# Patient Record
Sex: Female | Born: 1985 | Race: Black or African American | Hispanic: No | Marital: Single | Smoking: Former smoker
Health system: Southern US, Community
[De-identification: ages and names within clinical notes are randomized; demographics above are authoritative.]

## PROBLEM LIST (undated history)

## (undated) DIAGNOSIS — Z789 Other specified health status: Secondary | ICD-10-CM

## (undated) HISTORY — PX: NO PAST SURGERIES: SHX2092

## (undated) HISTORY — DX: Other specified health status: Z78.9

---
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
## 2012-12-22 ENCOUNTER — Emergency Department (HOSPITAL_COMMUNITY)
Admission: EM | Admit: 2012-12-22 | Discharge: 2012-12-22 | Disposition: A | Discharge status: Home / Self Care | Payer: Self-pay | Attending: Emergency Medicine | Admitting: Emergency Medicine

## 2012-12-22 ENCOUNTER — Encounter (HOSPITAL_COMMUNITY): Payer: Self-pay | Admitting: Emergency Medicine

## 2012-12-22 DIAGNOSIS — R112 Nausea with vomiting, unspecified: Secondary | ICD-10-CM | POA: Insufficient documentation

## 2012-12-22 DIAGNOSIS — R109 Unspecified abdominal pain: Secondary | ICD-10-CM | POA: Insufficient documentation

## 2012-12-22 DIAGNOSIS — R509 Fever, unspecified: Secondary | ICD-10-CM | POA: Insufficient documentation

## 2012-12-22 DIAGNOSIS — R102 Pelvic and perineal pain: Secondary | ICD-10-CM

## 2012-12-22 DIAGNOSIS — N898 Other specified noninflammatory disorders of vagina: Secondary | ICD-10-CM | POA: Insufficient documentation

## 2012-12-22 DIAGNOSIS — Z3202 Encounter for pregnancy test, result negative: Secondary | ICD-10-CM | POA: Insufficient documentation

## 2012-12-22 DIAGNOSIS — R197 Diarrhea, unspecified: Secondary | ICD-10-CM | POA: Insufficient documentation

## 2012-12-22 LAB — CBC WITH DIFFERENTIAL/PLATELET
Basophils Absolute: 0 10*3/uL (ref 0.0–0.1)
Basophils Relative: 1 % (ref 0–1)
HCT: 37.4 % (ref 36.0–46.0)
Lymphocytes Relative: 35 % (ref 12–46)
Lymphs Abs: 2 10*3/uL (ref 0.7–4.0)
MCH: 27.8 pg (ref 26.0–34.0)
Neutro Abs: 3 10*3/uL (ref 1.7–7.7)
Platelets: 292 10*3/uL (ref 150–400)
RBC: 4.5 MIL/uL (ref 3.87–5.11)
RDW: 13.7 % (ref 11.5–15.5)
WBC: 5.9 10*3/uL (ref 4.0–10.5)

## 2012-12-22 LAB — COMPREHENSIVE METABOLIC PANEL
ALT: 6 U/L (ref 0–35)
AST: 16 U/L (ref 0–37)
Albumin: 3.3 g/dL — ABNORMAL LOW (ref 3.5–5.2)
Alkaline Phosphatase: 68 U/L (ref 39–117)
CO2: 25 mEq/L (ref 19–32)
Calcium: 9.3 mg/dL (ref 8.4–10.5)
Chloride: 99 mEq/L (ref 96–112)
GFR calc non Af Amer: 89 mL/min — ABNORMAL LOW (ref >90)
Glucose, Bld: 104 mg/dL — ABNORMAL HIGH (ref 70–99)
Sodium: 133 mEq/L — ABNORMAL LOW (ref 135–145)
Total Bilirubin: 0.3 mg/dL (ref 0.3–1.2)

## 2012-12-22 LAB — WET PREP, GENITAL: Yeast Wet Prep HPF POC: NONE SEEN

## 2012-12-22 LAB — URINALYSIS, ROUTINE W REFLEX MICROSCOPIC
Bilirubin Urine: NEGATIVE
Glucose, UA: NEGATIVE mg/dL
Leukocytes, UA: NEGATIVE
Nitrite: NEGATIVE

## 2012-12-22 LAB — POCT PREGNANCY, URINE: Preg Test, Ur: NEGATIVE

## 2012-12-22 MED ORDER — AZITHROMYCIN 250 MG PO TABS
1000.0000 mg | ORAL_TABLET | Freq: Once | ORAL | Status: AC
  Administered 2012-12-22: 1000 mg via ORAL
  Filled 2012-12-22: qty 4

## 2012-12-22 MED ORDER — PROMETHAZINE HCL 25 MG PO TABS: 25.0000 mg | ORAL_TABLET | Freq: Four times a day (QID) | ORAL | Status: DC | PRN

## 2012-12-22 MED ORDER — CEFTRIAXONE SODIUM 250 MG IJ SOLR
250.0000 mg | Freq: Once | INTRAMUSCULAR | Status: AC
  Administered 2012-12-22: 250 mg via INTRAMUSCULAR
  Filled 2012-12-22: qty 250

## 2012-12-22 MED ORDER — HYDROCODONE-ACETAMINOPHEN 5-325 MG PO TABS: 1.0000 | ORAL_TABLET | ORAL | Status: DC | PRN

## 2012-12-22 MED ORDER — OXYCODONE-ACETAMINOPHEN 5-325 MG PO TABS
2.0000 | ORAL_TABLET | Freq: Once | ORAL | Status: AC
  Administered 2012-12-22: 2 via ORAL
  Filled 2012-12-22: qty 2

## 2012-12-22 NOTE — ED Provider Notes (Signed)
CSN: [id number]     Arrival date & time 12/22/12  1037 History   First MD Initiated Contact with Patient 12/22/12 1121     Chief Complaint  Patient presents with  . Abdominal Pain   (Consider location/radiation/quality/duration/timing/severity/associated sxs/prior Treatment) HPI  27 year old healthy female presents complaining of low abnormal pain. Patient report having suprapubic abdominal pain ongoing for nearly a month. Pain is a dull sensation, light, persistent for the past month however worsening in the past 3 or 4 days. Pain is now sharp, radiates to right lower quadrant worsening with walking and standing straight and improves with lying flat. For the past 2 days she also feels feverish with fever of 101 at home, improved with ibuprofen. Also having nausea, vomiting, and diarrhea. Vomiting is nonbilious non-bloody, diarrhea is nonbloody non-mucousy. Chest decreased appetite. She also noticed yellowish vaginal discharge with mild odor since yesterday. She is sexually active, G0P0, last menstrual period 3 weeks ago. No complaints of headache, chest pain, shortness of breath, back pain, dysuria, hematuria, hematochezia, or melena. No prior history of STD.  History reviewed. No pertinent past medical history. History reviewed. No pertinent past surgical history. History reviewed. No pertinent family history. History  Substance Use Topics  . Smoking status: Not on file  . Smokeless tobacco: Not on file  . Alcohol Use: Not on file   OB History   Grav Para Term Preterm Abortions TAB SAB Ect Mult Living                 Review of Systems  All other systems reviewed and are negative.    Allergies  Review of patient's allergies indicates no known allergies.  Home Medications   Current Outpatient Rx  Name  Route  Sig  Dispense  Refill  . ibuprofen (ADVIL,MOTRIN) 600 MG tablet   Oral   Take 600 mg by mouth every 6 (six) hours as needed for pain.          BP 97/59  Pulse 74   Temp(Src) 97.8 F (36.6 C) (Oral)  Resp 14  SpO2 100%  LMP 12/01/2012 Physical Exam  Nursing note and vitals reviewed. Constitutional: She appears well-developed and well-nourished. No distress.  HENT:  Head: Normocephalic and atraumatic.  Eyes: Conjunctivae are normal.  Neck: Normal range of motion. Neck supple.  Cardiovascular: Normal rate and regular rhythm.   Pulmonary/Chest: Effort normal and breath sounds normal. She exhibits no tenderness.  Abdominal: Soft. There is tenderness (Suprapubic tenderness without guarding or rebound tenderness. No hernia noted.) in the suprapubic area. There is no rigidity, no guarding, no tenderness at McBurney's point and negative Murphy's sign.  Genitourinary: Vagina normal and uterus normal. There is no rash or lesion on the right labia. There is no rash or lesion on the left labia. Cervix exhibits motion tenderness. Cervix exhibits no discharge. Right adnexum displays tenderness. Right adnexum displays no mass. Left adnexum displays tenderness. Left adnexum displays no mass. No erythema, tenderness or bleeding around the vagina. No vaginal discharge found.  Chaperone present:  Lymphadenopathy:       Right: No inguinal adenopathy present.       Left: No inguinal adenopathy present.    ED Course  Procedures (including critical care time)  Suprapubic abd pain.  Moderate tenderness to bilateral adnexa and CMT on pelvic exam.  Wet prep result unremarkable.  Will preemptively treat with rocephin/zithromax for possible infection.    2:52 PM Labs are otherwise reassuring.  Doubt appy.  Doubt TOA or  ovarian torsion.  Pregnancy test negative, doubt ectopic.  Plan to have pt return if worsen.  Pt otherwise stable for discharge.    Labs Review Labs Reviewed  WET PREP, GENITAL - Abnormal; Notable for the following:    Clue Cells Wet Prep HPF POC FEW (*)    WBC, Wet Prep HPF POC FEW (*)    All other components within normal limits  COMPREHENSIVE METABOLIC  PANEL - Abnormal; Notable for the following:    Sodium 133 (*)    Glucose, Bld 104 (*)    BUN 5 (*)    Albumin 3.3 (*)    GFR calc non Af Amer 89 (*)    All other components within normal limits  GC/CHLAMYDIA PROBE AMP  URINALYSIS, ROUTINE W REFLEX MICROSCOPIC  CBC WITH DIFFERENTIAL  LIPASE, BLOOD  POCT PREGNANCY, URINE   Imaging Review No results found.  EKG Interpretation   None       MDM   1. Pelvic pain    BP 97/59  Pulse 74  Temp(Src) 97.8 F (36.6 C) (Oral)  Resp 14  SpO2 100%  LMP 12/01/2012     Fayrene Helper, PA-C 12/22/12 1454

## 2012-12-22 NOTE — ED Notes (Signed)
She c/o lower abd./bladder area pain "for about a month now".  She further c/o "yellowish" vag. D/c.  She is in no distress.

## 2012-12-22 NOTE — ED Provider Notes (Signed)
Medical screening examination/treatment/procedure(s) were performed by non-physician practitioner and as supervising physician I was immediately available for consultation/collaboration.  Dasia Guerrier L Aayan Haskew, MD 12/22/12 1630 

## 2012-12-22 NOTE — Progress Notes (Signed)
[facility] provided patient with a list of primary care resources and a  [facility] card program application.

## 2012-12-23 LAB — GC/CHLAMYDIA PROBE AMP: GC Probe RNA: NEGATIVE

## 2013-07-20 ENCOUNTER — Emergency Department (HOSPITAL_COMMUNITY): Payer: Self-pay

## 2013-07-20 ENCOUNTER — Emergency Department (HOSPITAL_COMMUNITY)
Admission: EM | Admit: 2013-07-20 | Discharge: 2013-07-20 | Disposition: A | Discharge status: Home / Self Care | Payer: Self-pay | Attending: Emergency Medicine | Admitting: Emergency Medicine

## 2013-07-20 ENCOUNTER — Encounter (HOSPITAL_COMMUNITY): Payer: Self-pay | Admitting: Emergency Medicine

## 2013-07-20 DIAGNOSIS — R0602 Shortness of breath: Secondary | ICD-10-CM | POA: Insufficient documentation

## 2013-07-20 DIAGNOSIS — R079 Chest pain, unspecified: Secondary | ICD-10-CM | POA: Insufficient documentation

## 2013-07-20 DIAGNOSIS — R209 Unspecified disturbances of skin sensation: Secondary | ICD-10-CM | POA: Insufficient documentation

## 2013-07-20 DIAGNOSIS — R200 Anesthesia of skin: Secondary | ICD-10-CM

## 2013-07-20 DIAGNOSIS — R202 Paresthesia of skin: Secondary | ICD-10-CM

## 2013-07-20 DIAGNOSIS — F17200 Nicotine dependence, unspecified, uncomplicated: Secondary | ICD-10-CM | POA: Insufficient documentation

## 2013-07-20 DIAGNOSIS — R42 Dizziness and giddiness: Secondary | ICD-10-CM | POA: Insufficient documentation

## 2013-07-20 MED ORDER — LORAZEPAM 1 MG PO TABS
1.0000 mg | ORAL_TABLET | Freq: Once | ORAL | Status: AC
  Administered 2013-07-20: 1 mg via ORAL
  Filled 2013-07-20: qty 1

## 2013-07-20 MED ORDER — OXYCODONE-ACETAMINOPHEN 5-325 MG PO TABS
1.0000 | ORAL_TABLET | Freq: Once | ORAL | Status: AC
  Administered 2013-07-20: 1 via ORAL
  Filled 2013-07-20: qty 1

## 2013-07-20 NOTE — ED Notes (Signed)
Patient transported to X-ray 

## 2013-07-20 NOTE — ED Notes (Signed)
Pt states that this morning she felt dizzy/lightheadedness then after she got done smoking her cigarette she started having central chest tightness and shob.  Pt states that she also has numbness in her left arm and leg. Pt states that last night she had n/v but denies diarrhea.  Pt is currently on her menstrual cycle and taking ibuprofen for the pain.

## 2013-07-20 NOTE — ED Provider Notes (Signed)
CSN: [id number]     Arrival date & time 07/20/13  7517 History   First MD Initiated Contact with Patient 07/20/13 0827     Chief Complaint  Patient presents with  . Dizziness  . Chest Pain  . Shortness of Breath  . Numbness     (Consider location/radiation/quality/duration/timing/severity/associated sxs/prior Treatment) HPI  28 year old female chest pain. Onset approximately one and half hours ago. Describes a tightness in the center chest. This began while she was getting ready for work this morning and worse after smoking a cigarette. Worse when laying on back. Associated with numbness and tingling in her left upper extremity and left lower extremity. Some mild shortness of breath. No cough. No fevers or chills. No unusual leg pain or swelling. No history similar symptoms. Denies drug use.   History reviewed. No pertinent past medical history. History reviewed. No pertinent past surgical history. No family history on file. History  Substance Use Topics  . Smoking status: Current Every Day Smoker  . Smokeless tobacco: Not on file  . Alcohol Use: Yes   OB History   Grav Para Term Preterm Abortions TAB SAB Ect Mult Living                 Review of Systems  All systems reviewed and negative, other than as noted in HPI.   Allergies  Review of patient's allergies indicates no known allergies.  Home Medications   Prior to Admission medications   Medication Sig Start Date End Date Taking? Authorizing Provider  HYDROcodone-acetaminophen (NORCO/VICODIN) 5-325 MG per tablet Take 1 tablet by mouth every 4 (four) hours as needed for pain. 12/22/12   Fayrene Helper, PA-C  ibuprofen (ADVIL,MOTRIN) 600 MG tablet Take 600 mg by mouth every 6 (six) hours as needed for pain.    Historical Provider, MD  promethazine (PHENERGAN) 25 MG tablet Take 1 tablet (25 mg total) by mouth every 6 (six) hours as needed for nausea. 12/22/12   Fayrene Helper, PA-C   BP 114/77  Pulse 63  Temp(Src) 99.2 F  (37.3 C) (Oral)  Resp 18  Ht 5\' 5"  (1.651 m)  Wt 198 lb (89.812 kg)  BMI 32.95 kg/m2  SpO2 100%  LMP 07/19/2013 Physical Exam  Nursing note and vitals reviewed. Constitutional: She appears well-developed and well-nourished. No distress.  Sitting in bed. NAD.   HENT:  Head: Normocephalic and atraumatic.  Eyes: Conjunctivae are normal. Right eye exhibits no discharge. Left eye exhibits no discharge.  Neck: Neck supple.  Cardiovascular: Normal rate, regular rhythm and normal heart sounds.  Exam reveals no gallop and no friction rub.   No murmur heard. Pulmonary/Chest: Effort normal and breath sounds normal. No respiratory distress. She exhibits no tenderness.  Abdominal: Soft. She exhibits no distension. There is no tenderness.  Musculoskeletal: She exhibits no edema and no tenderness.  Lower extremities symmetric as compared to each other. No calf tenderness. Negative Homan's. No palpable cords.   Neurological: She is alert.  Skin: Skin is warm and dry.  Psychiatric: She has a normal mood and affect. Her behavior is normal. Thought content normal.    ED Course  Procedures (including critical care time) Labs Review Labs Reviewed - No data to display  Imaging Review No results found.   EKG Interpretation   Date/Time:  Wednesday Jul 20 2013 08:26:49 EDT Ventricular Rate:  65 PR Interval:  167 QRS Duration: 86 QT Interval:  410 QTC Calculation: 426 R Axis:   65 Text Interpretation:  Sinus  rhythm Baseline wander in lead(s) II III aVR  aVF V1 V4 V6 No old tracing to compare Confirmed by Huston Stonehocker  MD, Carlin Attridge  ([id number]) on 07/20/2013 8:34:42 AM      MDM   Final diagnoses:  Chest pain  Numbness and tingling    27yF with chest pain and numbness in the left side. She is in no distress. Lungs are clear. Nonfocal neurological examination. Doubt ACS. Doubt infectious. Doubt dissection or pulmonary embolism. EKG with no particular concerning findings.    Raeford RazorStephen Tongela Encinas,  MD 07/23/13 [phone]

## 2013-07-20 NOTE — Discharge Instructions (Signed)
Chest Pain (Nonspecific) °It is often hard to give a specific diagnosis for the cause of chest pain. There is always a chance that your pain could be related to something serious, such as a heart attack or a blood clot in the lungs. You need to follow up with your caregiver for further evaluation. °CAUSES  °· Heartburn. °· Pneumonia or bronchitis. °· Anxiety or stress. °· Inflammation around your heart (pericarditis) or lung (pleuritis or pleurisy). °· A blood clot in the lung. °· A collapsed lung (pneumothorax). It can develop suddenly on its own (spontaneous pneumothorax) or from injury (trauma) to the chest. °· Shingles infection (herpes zoster virus). °The chest wall is composed of bones, muscles, and cartilage. Any of these can be the source of the pain. °· The bones can be bruised by injury. °· The muscles or cartilage can be strained by coughing or overwork. °· The cartilage can be affected by inflammation and become sore (costochondritis). °DIAGNOSIS  °Lab tests or other studies, such as X-rays, electrocardiography, stress testing, or cardiac imaging, may be needed to find the cause of your pain.  °TREATMENT  °· Treatment depends on what may be causing your chest pain. Treatment may include: °· Acid blockers for heartburn. °· Anti-inflammatory medicine. °· Pain medicine for inflammatory conditions. °· Antibiotics if an infection is present. °· You may be advised to change lifestyle habits. This includes stopping smoking and avoiding alcohol, caffeine, and chocolate. °· You may be advised to keep your head raised (elevated) when sleeping. This reduces the chance of acid going backward from your stomach into your esophagus. °· Most of the time, nonspecific chest pain will improve within 2 to 3 days with rest and mild pain medicine. °HOME CARE INSTRUCTIONS  °· If antibiotics were prescribed, take your antibiotics as directed. Finish them even if you start to feel better. °· For the next few days, avoid physical  activities that bring on chest pain. Continue physical activities as directed. °· Do not smoke. °· Avoid drinking alcohol. °· Only take over-the-counter or prescription medicine for pain, discomfort, or fever as directed by your caregiver. °· Follow your caregiver's suggestions for further testing if your chest pain does not go away. °· Keep any follow-up appointments you made. If you do not go to an appointment, you could develop lasting (chronic) problems with pain. If there is any problem keeping an appointment, you must call to reschedule. °SEEK MEDICAL CARE IF:  °· You think you are having problems from the medicine you are taking. Read your medicine instructions carefully. °· Your chest pain does not go away, even after treatment. °· You develop a rash with blisters on your chest. °SEEK IMMEDIATE MEDICAL CARE IF:  °· You have increased chest pain or pain that spreads to your arm, neck, jaw, back, or abdomen. °· You develop shortness of breath, an increasing cough, or you are coughing up blood. °· You have severe back or abdominal pain, feel nauseous, or vomit. °· You develop severe weakness, fainting, or chills. °· You have a fever. °THIS IS AN EMERGENCY. Do not wait to see if the pain will go away. Get medical help at once. Call your local emergency services (911 in [place] Do not drive yourself to the hospital. °MAKE SURE YOU:  °· Understand these instructions. °· Will watch your condition. °· Will get help right away if you are not doing well or get worse. °Document Released: 11/20/2004 Document Revised: 05/05/2011 Document Reviewed: 09/16/2007 °ExitCare® Patient Information ©2014 [employer],   LLC. ° ° ° °Emergency Department Resource Guide °1) Find a Doctor and Pay Out of Pocket °Although you won't have to find out who is covered by your insurance plan, it is a good idea to ask around and get recommendations. You will then need to call the office and see if the doctor you have chosen will accept you as a new  patient and what types of options they offer for patients who are self-pay. Some doctors offer discounts or will set up payment plans for their patients who do not have insurance, but you will need to ask so you aren't surprised when you get to your appointment. ° °2) Contact Your Local Health Department °Not all health departments have doctors that can see patients for sick visits, but many do, so it is worth a call to see if yours does. If you don't know where your local health department is, you can check in your phone book. The CDC also has a tool to help you locate your state's health department, and many state websites also have listings of all of their local health departments. ° °3) Find a Walk-in Clinic °If your illness is not likely to be very severe or complicated, you may want to try a walk in clinic. These are popping up all over the country in pharmacies, drugstores, and shopping centers. They're usually staffed by nurse practitioners or physician assistants that have been trained to treat common illnesses and complaints. They're usually fairly quick and inexpensive. However, if you have serious medical issues or chronic medical problems, these are probably not your best option. ° °No Primary Care Doctor: °- Call [facility] at  832-8000 - they can help you locate a primary care doctor that  accepts your insurance, provides certain services, etc. °- Physician Referral Service- 1-800-533-3463 ° °Chronic Pain Problems: °Organization         Address  Phone   Notes  °[facility]  (336) 297-2271 Patients need to be referred by their primary care doctor.  ° °Medication Assistance: °Organization         Address  Phone   Notes  °[place] [facility] [address] °[place], [place] [address] (336) 641-8030 --Must be a resident of [place] °-- Must have NO insurance coverage whatsoever (no Medicaid/ Medicare, etc.) °-- The pt. MUST have a primary  care doctor that directs their care regularly and follows them in the community °  °[facility]  (866) 331-1348   °[employer]  (888) 892-1162   ° °Agencies that provide inexpensive medical care: °Organization         Address  Phone   Notes  °[facility]  (336) 832-8035   ° Internal Medicine    (336) 832-7272   °[facility] [address] °[place], [place] [address] (336) 832-4777   °[facility] [address], °[place] (336) 271-4999   °[facility]    (336) 373-0678   °[facility]    (336) 272-1050   °[facility] ° [address], [place] Phone:  (336) 832-4444, Fax:  (336) 832-4440 Hours of Operation:  9 am - 6 pm, M-F.  Also accepts Medicaid/Medicare and self-pay.  °[facility] ° [address], [place] Phone: (336) 832-3150, Fax: (336) [phone]. Hours of Operation:  8:30 am - 5:30 pm, M-F.  Also accepts Medicaid and self-pay.  °  [facility] [place] [address], [place] Phone: (336) 878-6027   °[facility] [address], [place], [place] (336)723-1848, Ext. 123 Mondays & Thursdays: 7-9 AM.  First 15 patients are seen on a first come, first serve basis. °  ° °Medicaid-accepting [place] Providers: ° °Organization         Address  Phone   Notes  °[facility] [address], [place] (336) 641-2100 Also accepts self-pay patients.  °[facility] [address], [place] ° (336) 856-9996   °[facility] [address], [place] (336) 288-8857   °[facility] [address] [place] [address], [place] (336) 299-7000   °Veita Bland [address], [place]  ° (336) 373-1557 Only accepts [place] Access Medicaid patients after they have their name applied to their card.  ° °Self-Pay (no insurance) in [place]: ° °Organization         Address  Phone   Notes  °Sickle Cell Patients, [facility] [address], [place] (336) 832-1970   °[facility] [address], [place] (336) 832-4400   °[facility] ° [address], [place] (336) 992-4800   °[facility]/Dr. Osei-Bonsu ° [address] [place] [address], [place] or [address], [place] (336) [phone] Phone number for both [place] and [place] locations is the same.  °[facility] [address], [place] (336) 299-0000   °[facility] [address] [place] [address], [place] or [address] (336) 852-7530 °(336) 878-2260   °[facility] [address], [place] (336) 350-1642, phone; (336) 294-5005, fax Sees patients 1st and 3rd Saturday of every month.  Must not qualify for public or private insurance (i.e. Medicaid, Medicare, [place] Health Choice, Veterans' Benefits) • Household income should be no more than 200% of the poverty level •The clinic cannot treat you if you are pregnant or think you are pregnant • Sexually transmitted diseases are not treated at the clinic.  ° ° °Dental Care: °Organization         Address  Phone  Notes  °[place] [facility] [address], [place] (336) 641-6152 Accepts children up to age 21 who are enrolled in Medicaid or [place] Health Choice; pregnant women with a Medicaid card; and children who have applied for Medicaid or [place] Health Choice, but were declined, whose parents can pay a reduced fee at time of service.  °[facility] [place]  [address], [place] (336) 641-7733 Accepts children up to age 21 who are enrolled in Medicaid or [place] Health Choice; pregnant women with a Medicaid card; and children who have applied for Medicaid or [place] Health Choice, but were declined, whose parents can  pay a reduced fee at time of service.  °[facility] ° [address], [place] (336) 641-4533 Patients are seen by appointment only. Walk-ins are not accepted. [facility] will see patients 18 years of age and older. °Monday - Tuesday (8am-5pm) °Most Wednesdays (8:30-5pm) °$30 per visit, cash only  °[facility] ° [address], [place] (336) 641-4533 Patients are seen by appointment only. Walk-ins are not accepted. [facility] will see patients 18 years of   age and older. °One Wednesday Evening (Monthly: Volunteer Based).  $30 per visit, cash only  °[facility]  (919) 537-3737 for adults; Children under age 4, call Graduate Pediatric Dentistry at ([phone]) [phone]. Children aged 4-14, please call (919) [phone] to request a pediatric application. ° Dental services are provided in all areas of dental care including fillings, crowns and bridges, complete and partial dentures, implants, gum treatment, root canals, and extractions. Preventive care is also provided. Treatment is provided to both adults and children. °Patients are selected via a lottery and there is often a waiting list. °  °[facility] [address], °[place] ° (336) 763-8833 [email or web address] °  °[facility] [address], [place], [place] (336)723-1848, Ext. 123 Second and Fourth Thursday of each month, opens at 6:30 AM; Clinic ends at 9 AM.  Patients are seen on a first-come first-served basis, and a limited number are seen during each clinic.  ° °[facility] ° [address], [place], [place] (336) 723-7904   Eligibility Requirements °You must have lived in [place], or [place] counties for at least the last three months. °  You cannot be eligible for state or federal sponsored healthcare insurance, including Veterans Administration, Medicaid, or Medicare. °  You generally cannot be eligible for healthcare  insurance through your employer.  °  How to apply: °Eligibility screenings are held every Tuesday and Wednesday afternoon from 1:00 pm until 4:00 pm. You do not need an appointment for the interview!  °[facility] [address], [place], [place] 336-631-2330   °[facility] [facility]  336-342-8273   °[facility]  336-703-3100   °[facility]  336-570-6415   ° °Behavioral Health Resources in the Community: °Intensive Outpatient Programs °Organization         Address  Phone  Notes  °[facility] [address], [place], [place] 336-878-6098   °[facility] [address], [place], [place] 336-832-9800   °[facility] [address], [place], [place] ° 336-882-2125   °[facility] [address],  °[place], [place] 1-800-853-5163 or 336-641-4981   °Substance Abuse Resources °Organization         Address  Phone  Notes  °[facility]  336-882-2125   °[facility]  336-784-9470   °[facility]  336-285-9073   °[facility]  336-845-3988   °Residential & Outpatient Substance Abuse Program  1-800-659-3381   °Psychological Services °Organization         Address  Phone  Notes  °[facility]  336- 832-9600   °[facility]  336- 378-7881   °[facility] [address], [place] 1-800-853-5163 or 336-641-4981   ° °Mobile Crisis Teams °Organization         Address  Phone  Notes  °[facility], Mobile Crisis Care Unit  1-877-626-1772   °Assertive °[facility] ° [address] [place], [place] 336-834-9664   °Sharon DeEsch [address] °[place] 336-554-5454   ° °Self-Help/Support Groups °Organization         Address  Phone             Notes  °[facility]  - variety of support groups  336- 373-1402 Call for more information  °Narcotics Anonymous (NA),  [facility] [address], °[place]  2   meetings at this location  ° °Residential Treatment Programs °Organization         Address  Phone  Notes  °[facility] [address],    °[place]  1-866-801-8205   °[facility] ° [address], [place], [place] 704-293-8524   °[facility] [address], [place] 336-845-3988 Admissions: 8am-3pm M-F  °[facility] [address]    °[place], [place] 336-841-1104   °[facility] [address], [place], [place] 336-379-7146   °[facility] [address]  °[place], [place] 336-285-9073   °[facility] - Intensive Outpatient [address], [place], [place] 336-852-3033   °[facility] [address]  °[place], [place] 1-877-615-2722 or 336-784-9470   °[facility] ([facility] [address] [place], [place] 336-227-7417 Accepts Medicaid  °[facility] [address]  ° [place] 1-800-659-3381 Substance Abuse/Addiction Treatment  ° °[place] Behavioral Health Resources °Organization         Address  Phone  Notes  °[facility]  (888) 581-9988   °Julie Brannon, PhD [address] [place], [place]   (336) 349-5553 or (336) 951-0000   °[facility]   [address] °[place], [place] (336) 349-4454   °[facility] [address], [place], [place] (336) 342-8316 Insurance/Medicaid/sponsorship through [facility]  °[facility] [address]                                    [place], [place] (336) 342-8316 Therapy/tele-psych/case  °[facility] [address]  ° [place], [place] (336) 349-2233    °Dr. Arfeen  (336) 349-4544   °[facility] [place]  [employer] [place] [facility] 1) [address], [place] °2) [address], [place] °3)  [address], [place] (336) 349-3220 °(336) 342-7768 ° °(336) 342-8140    °[place] [facility] (336) 342-1394 or (336) 342-3537 (After Hours)    ° ° ° °

## 2013-07-20 NOTE — Progress Notes (Signed)
[facility] provided pt with a list of primary care resources. Patient stated pending insurance through job.  °

## 2014-03-02 ENCOUNTER — Encounter (HOSPITAL_COMMUNITY): Payer: Self-pay | Admitting: Emergency Medicine

## 2014-03-02 DIAGNOSIS — Z79899 Other long term (current) drug therapy: Secondary | ICD-10-CM | POA: Insufficient documentation

## 2014-03-02 DIAGNOSIS — Z3202 Encounter for pregnancy test, result negative: Secondary | ICD-10-CM | POA: Insufficient documentation

## 2014-03-02 DIAGNOSIS — N926 Irregular menstruation, unspecified: Secondary | ICD-10-CM | POA: Insufficient documentation

## 2014-03-02 DIAGNOSIS — Z720 Tobacco use: Secondary | ICD-10-CM | POA: Insufficient documentation

## 2014-03-02 DIAGNOSIS — Z791 Long term (current) use of non-steroidal anti-inflammatories (NSAID): Secondary | ICD-10-CM | POA: Insufficient documentation

## 2014-03-02 LAB — URINALYSIS, ROUTINE W REFLEX MICROSCOPIC
Bilirubin Urine: NEGATIVE
GLUCOSE, UA: NEGATIVE mg/dL
HGB URINE DIPSTICK: NEGATIVE
KETONES UR: 15 mg/dL — AB
LEUKOCYTES UA: NEGATIVE
Nitrite: NEGATIVE
PH: 7 (ref 5.0–8.0)
Protein, ur: NEGATIVE mg/dL
Specific Gravity, Urine: 1.02 (ref 1.005–1.030)
Urobilinogen, UA: 1 mg/dL (ref 0.0–1.0)

## 2014-03-02 LAB — COMPREHENSIVE METABOLIC PANEL
ALT: 12 U/L (ref 0–35)
AST: 21 U/L (ref 0–37)
Albumin: 3.3 g/dL — ABNORMAL LOW (ref 3.5–5.2)
Alkaline Phosphatase: 70 U/L (ref 39–117)
Anion gap: 9 (ref 5–15)
BUN: 8 mg/dL (ref 6–23)
CALCIUM: 8.9 mg/dL (ref 8.4–10.5)
CO2: 22 mmol/L (ref 19–32)
Chloride: 106 mEq/L (ref 96–112)
Creatinine, Ser: 0.96 mg/dL (ref 0.50–1.10)
GFR calc Af Amer: >90 mL/min (ref >90)
GFR calc non Af Amer: 79 mL/min — ABNORMAL LOW (ref >90)
Glucose, Bld: 119 mg/dL — ABNORMAL HIGH (ref 70–99)
Potassium: 3.7 mmol/L (ref 3.5–5.1)
SODIUM: 137 mmol/L (ref 135–145)
Total Bilirubin: 0.4 mg/dL (ref 0.3–1.2)
Total Protein: 6.5 g/dL (ref 6.0–8.3)

## 2014-03-02 LAB — CBC WITH DIFFERENTIAL/PLATELET
Basophils Absolute: 0 10*3/uL (ref 0.0–0.1)
Basophils Relative: 1 % (ref 0–1)
EOS ABS: 0.2 10*3/uL (ref 0.0–0.7)
EOS PCT: 3 % (ref 0–5)
HEMATOCRIT: 36.3 % (ref 36.0–46.0)
HEMOGLOBIN: 11.7 g/dL — AB (ref 12.0–15.0)
LYMPHS ABS: 3.1 10*3/uL (ref 0.7–4.0)
LYMPHS PCT: 43 % (ref 12–46)
MCH: 26.4 pg (ref 26.0–34.0)
MCHC: 32.2 g/dL (ref 30.0–36.0)
MCV: 81.9 fL (ref 78.0–100.0)
MONOS PCT: 10 % (ref 3–12)
Monocytes Absolute: 0.7 10*3/uL (ref 0.1–1.0)
Neutro Abs: 3.1 10*3/uL (ref 1.7–7.7)
Neutrophils Relative %: 43 % (ref 43–77)
PLATELETS: 283 10*3/uL (ref 150–400)
RBC: 4.43 MIL/uL (ref 3.87–5.11)
RDW: 15 % (ref 11.5–15.5)
WBC: 7.2 10*3/uL (ref 4.0–10.5)

## 2014-03-02 LAB — PREGNANCY, URINE: Preg Test, Ur: NEGATIVE

## 2014-03-02 NOTE — ED Notes (Signed)
Pt. reports RLQ pain with nausea and vomitting onset today and  1st day of menstrual period  , no diarrhea or fever .

## 2014-03-03 ENCOUNTER — Emergency Department (HOSPITAL_COMMUNITY): Payer: Self-pay

## 2014-03-03 ENCOUNTER — Encounter (HOSPITAL_COMMUNITY): Payer: Self-pay | Admitting: Radiology

## 2014-03-03 ENCOUNTER — Emergency Department (HOSPITAL_COMMUNITY)
Admission: EM | Admit: 2014-03-03 | Discharge: 2014-03-03 | Disposition: A | Discharge status: Home / Self Care | Payer: Self-pay | Attending: Emergency Medicine | Admitting: Emergency Medicine

## 2014-03-03 DIAGNOSIS — R1031 Right lower quadrant pain: Secondary | ICD-10-CM

## 2014-03-03 DIAGNOSIS — R102 Pelvic and perineal pain: Secondary | ICD-10-CM

## 2014-03-03 DIAGNOSIS — N946 Dysmenorrhea, unspecified: Secondary | ICD-10-CM

## 2014-03-03 MED ORDER — MORPHINE SULFATE 4 MG/ML IJ SOLN
4.0000 mg | Freq: Once | INTRAMUSCULAR | Status: AC
  Administered 2014-03-03: 4 mg via INTRAVENOUS
  Filled 2014-03-03: qty 1

## 2014-03-03 MED ORDER — IOHEXOL 300 MG/ML  SOLN
25.0000 mL | Freq: Once | INTRAMUSCULAR | Status: AC | PRN
  Administered 2014-03-03: 25 mL via ORAL

## 2014-03-03 MED ORDER — ONDANSETRON HCL 4 MG/2ML IJ SOLN
4.0000 mg | Freq: Once | INTRAMUSCULAR | Status: AC
  Administered 2014-03-03: 4 mg via INTRAVENOUS
  Filled 2014-03-03: qty 2

## 2014-03-03 MED ORDER — SODIUM CHLORIDE 0.9 % IV BOLUS (SEPSIS)
1000.0000 mL | Freq: Once | INTRAVENOUS | Status: AC
  Administered 2014-03-03: 1000 mL via INTRAVENOUS

## 2014-03-03 MED ORDER — KETOROLAC TROMETHAMINE 30 MG/ML IJ SOLN
30.0000 mg | Freq: Once | INTRAMUSCULAR | Status: AC
  Administered 2014-03-03: 30 mg via INTRAVENOUS
  Filled 2014-03-03: qty 1

## 2014-03-03 MED ORDER — HYDROCODONE-ACETAMINOPHEN 5-325 MG PO TABS: 1.0000 | ORAL_TABLET | Freq: Four times a day (QID) | ORAL | Status: DC | PRN

## 2014-03-03 MED ORDER — IOHEXOL 300 MG/ML  SOLN
100.0000 mL | Freq: Once | INTRAMUSCULAR | Status: AC | PRN
  Administered 2014-03-03: 100 mL via INTRAVENOUS

## 2014-03-03 NOTE — ED Provider Notes (Signed)
CSN: [id number]     Arrival date & time 03/02/14  2214 History  This chart was scribed for [name], MD by Murriel Hopper, ED Scribe. This patient was seen in room D36C/D36C and the patient's care was started at 12:27 AM.    Chief Complaint  Patient presents with  . Abdominal Pain    The history is provided by the patient. No language interpreter was used.     HPI Comments: [name] is a 29 y.o. female who presents to the Emergency Department complaining of constant, dull abdominal pain with associated cramps that have been constant since earlier today. Pt states that today is the first day of her menstrual period and that she has had similar symptoms in the past, as last month she had cramps and spotting in the same area, as well as pain radiating down her leg when symptoms would occur. Pt states that she now has a dull pain in the same area of her abdomen. Pt denies dysuria, vomiting, any abdominal surgeries, and any other medical problems.       History reviewed. No pertinent past medical history. History reviewed. No pertinent past surgical history. No family history on file. History  Substance Use Topics  . Smoking status: Current Every Day Smoker  . Smokeless tobacco: Not on file  . Alcohol Use: Yes   OB History    No data available     Review of Systems  Gastrointestinal: Positive for abdominal pain. Negative for vomiting.  Genitourinary: Negative for dysuria.  All other systems reviewed and are negative.     Allergies  Review of patient's allergies indicates no known allergies.  Home Medications   Prior to Admission medications   Medication Sig Start Date End Date Taking? Authorizing Provider  naproxen (NAPROSYN) 250 MG tablet Take 1,000 mg by mouth 2 (two) times daily as needed for mild pain.   Yes Historical Provider, MD  Zinc 50 MG TABS Take 50 mg by mouth daily.   Yes Historical Provider, MD  ibuprofen (ADVIL,MOTRIN) 800 MG tablet Take 800 mg by  mouth every 8 (eight) hours as needed.    Historical Provider, MD   BP 96/55 mmHg  Pulse 68  Temp(Src) 98.1 F (36.7 C) (Oral)  Resp 18  Ht  (1.651 m)  Wt 188 lb (85.276 kg)  BMI 31.28 kg/m2  SpO2 99%  LMP 03/02/2014 (Exact Date) Physical Exam  Constitutional: She is oriented to person, place, and time. She appears well-developed and well-nourished. No distress.  HENT:  Head: Normocephalic and atraumatic.  Mouth/Throat: Oropharynx is clear and moist. No oropharyngeal exudate.  Eyes: Conjunctivae and EOM are normal. Pupils are equal, round, and reactive to light.  Neck: Normal range of motion. Neck supple.  Cardiovascular: Normal rate, regular rhythm, normal heart sounds and intact distal pulses.   No murmur heard. Pulmonary/Chest: Effort normal and breath sounds normal. No respiratory distress.  Abdominal: Soft. There is tenderness. There is no rebound.  RLQ tenderness  No rebound   Musculoskeletal: Normal range of motion. She exhibits no edema or tenderness.  Neurological: She is alert and oriented to person, place, and time. No cranial nerve deficit. She exhibits normal muscle tone. Coordination normal.  Skin: Skin is warm.  Psychiatric: She has a normal mood and affect. Her behavior is normal.  Nursing note and vitals reviewed.   ED Course  Procedures (including critical care time)  DIAGNOSTIC STUDIES: Oxygen Saturation is 100% on RA, normal by my interpretation.  COORDINATION OF CARE: 12:33 AM Discussed treatment plan with pt at bedside and pt agreed to plan.   Labs Review Labs Reviewed  CBC WITH DIFFERENTIAL - Abnormal; Notable for the following:    Hemoglobin 11.7 (*)    All other components within normal limits  COMPREHENSIVE METABOLIC PANEL - Abnormal; Notable for the following:    Glucose, Bld 119 (*)    Albumin 3.3 (*)    GFR calc non Af Amer 79 (*)    All other components within normal limits  URINALYSIS, ROUTINE W REFLEX MICROSCOPIC - Abnormal;  Notable for the following:    Ketones, ur 15 (*)    All other components within normal limits  PREGNANCY, URINE    Imaging Review [place] Pelvis Complete  03/03/2014   CLINICAL DATA:  Right adnexal tenderness.  Normal CT same day.  EXAM: TRANSABDOMINAL ULTRASOUND OF PELVIS  TECHNIQUE: Transabdominal ultrasound examination of the pelvis was performed including evaluation of the uterus, ovaries, adnexal regions, and pelvic cul-de-sac.  COMPARISON:  CT abdomen and pelvis 03/03/2014  FINDINGS: Uterus  Measurements: 8.4 x 4.1 x 5.8 cm. Retroverted. No fibroids or other mass visualized.  Endometrium  Thickness: 4.5 mm.  No focal abnormality visualized.  Right ovary  Measurements: Limited visualization. Visualized right ovary measures 3.1 x 2 x 1.8 cm. No abnormal adnexal mass is appreciated.  Left ovary  Not visualized.  No masses seen.  Other findings:  No free fluid  Patient refused transvaginal examination.  IMPRESSION: Normal ultrasound appearance of retroverted uterus. Limited visualization of the right ovary appears normal. Left ovary is not visualized.   Electronically Signed   By: Burman NievesWilliam  Stevens M.D.   On: 03/03/2014 03:46   Ct Abdomen Pelvis W Contrast  03/03/2014   CLINICAL DATA:  Right lower quadrant pain with nausea and vomiting today. First day of menstrual period.  EXAM: CT ABDOMEN AND PELVIS WITH CONTRAST  TECHNIQUE: Multidetector CT imaging of the abdomen and pelvis was performed using the standard protocol following bolus administration of intravenous contrast.  CONTRAST:  100mL OMNIPAQUE IOHEXOL 300 MG/ML  SOLN  COMPARISON:  None.  FINDINGS: Minimal dependent changes in the lung bases.  The liver, spleen, gallbladder, pancreas, adrenal glands, kidneys, abdominal aorta, inferior vena cava, and retroperitoneal lymph nodes are unremarkable. The stomach, small bowel, and colon appear grossly normal although under distention limits evaluation. No free air or free fluid in the abdomen. Abdominal wall  musculature appears intact.  Pelvis: Uterus and ovaries are not enlarged. Tampon in the vagina. No free or loculated pelvic fluid collections. No pelvic mass or lymphadenopathy. Bladder wall is not thickened. The appendix is normal. No destructive bone lesions appreciated.  IMPRESSION: No acute process demonstrated in the abdomen or pelvis. Appendix is normal.   Electronically Signed   By: Burman NievesWilliam  Stevens M.D.   On: 03/03/2014 02:15     EKG Interpretation None      MDM   Final diagnoses:  RLQ abdominal pain  Right adnexal tenderness    Lindsay Roberts is a 29 y.o. female here with RLQ pain. Consider ovarian cyst vs appy vs endometriosis. Will get CT ab/pel. If neg, will need US transvag.   4:01 AM CT unremarkable. US showed no cyst. I think likely menstrual cramps with possible endometriosis. Will dc with pain meds. Will have her f/u with GYN.    I personally performed the services described in this documentation, which was scribed in my presence. The recorded information has been reviewed and is  accurate.    [name], MD 03/03/14 [phone]

## 2014-03-03 NOTE — ED Notes (Signed)
Pt updated on wait for treatment room. 

## 2014-03-03 NOTE — ED Notes (Signed)
Patient is alert and orientedx4.  Patient was explained discharge instructions and they understood them with no questions.  The patient's significant other, Jamison Neighborshley Milton is taking the patient home.

## 2014-03-03 NOTE — Discharge Instructions (Signed)
Continue taking motrin 800 mg every 6 hrs for pain.   Take vicodin for severe pain. Do NOT drive with it.   Follow up with GYN clinic.   Return to ER if you have severe pain, vomiting, fevers.

## 2014-07-14 ENCOUNTER — Encounter: Payer: Self-pay | Admitting: Emergency Medicine

## 2014-07-14 ENCOUNTER — Emergency Department
Admission: EM | Admit: 2014-07-14 | Discharge: 2014-07-14 | Disposition: A | Discharge status: Home / Self Care | Payer: Self-pay | Attending: Emergency Medicine | Admitting: Emergency Medicine

## 2014-07-14 DIAGNOSIS — Z79899 Other long term (current) drug therapy: Secondary | ICD-10-CM | POA: Insufficient documentation

## 2014-07-14 DIAGNOSIS — Z720 Tobacco use: Secondary | ICD-10-CM | POA: Insufficient documentation

## 2014-07-14 DIAGNOSIS — M436 Torticollis: Secondary | ICD-10-CM | POA: Insufficient documentation

## 2014-07-14 MED ORDER — KETOROLAC TROMETHAMINE 60 MG/2ML IM SOLN
60.0000 mg | Freq: Once | INTRAMUSCULAR | Status: AC
  Administered 2014-07-14: 60 mg via INTRAMUSCULAR

## 2014-07-14 MED ORDER — HYDROMORPHONE HCL 1 MG/ML IJ SOLN
INTRAMUSCULAR | Status: AC
  Administered 2014-07-14: 1 mg via INTRAMUSCULAR
  Filled 2014-07-14: qty 1

## 2014-07-14 MED ORDER — METHOCARBAMOL 750 MG PO TABS: 1500.0000 mg | ORAL_TABLET | Freq: Four times a day (QID) | ORAL | Status: DC

## 2014-07-14 MED ORDER — ORPHENADRINE CITRATE 30 MG/ML IJ SOLN
INTRAMUSCULAR | Status: AC
  Administered 2014-07-14: 60 mg via INTRAMUSCULAR
  Filled 2014-07-14: qty 2

## 2014-07-14 MED ORDER — KETOROLAC TROMETHAMINE 60 MG/2ML IM SOLN
INTRAMUSCULAR | Status: AC
  Administered 2014-07-14: 60 mg via INTRAMUSCULAR
  Filled 2014-07-14: qty 2

## 2014-07-14 MED ORDER — HYDROMORPHONE HCL 1 MG/ML IJ SOLN
1.0000 mg | Freq: Once | INTRAMUSCULAR | Status: AC
  Administered 2014-07-14: 1 mg via INTRAMUSCULAR

## 2014-07-14 MED ORDER — ORPHENADRINE CITRATE 30 MG/ML IJ SOLN
60.0000 mg | Freq: Once | INTRAMUSCULAR | Status: AC
  Administered 2014-07-14: 60 mg via INTRAMUSCULAR

## 2014-07-14 MED ORDER — TRAMADOL HCL 50 MG PO TABS: 50.0000 mg | ORAL_TABLET | Freq: Four times a day (QID) | ORAL | Status: DC | PRN

## 2014-07-14 NOTE — ED Provider Notes (Signed)
[facility] Emergency Department Provider Note  ____________________________________________  Time seen: Approximately 5:50 PM  I have reviewed the triage vital signs and the nursing notes.   HISTORY  Chief Complaint Neck Injury    HPI Lindsay Roberts is a 29 y.o. female patient complaining approximately 3 weeks of neck pain which is mostly on the left lateral side. The patient states she believes she woke up with this pain and it  i worse today. Pain increase her right lateral motions. Patient denies any radicular component to her pain.Patient is rating the pain as a 10 over 10.   History reviewed. No pertinent past medical history.  There are no active problems to display for this patient.   History reviewed. No pertinent past surgical history.  Current Outpatient Rx  Name  Route  Sig  Dispense  Refill  . HYDROcodone-acetaminophen (NORCO/VICODIN) 5-325 MG per tablet   Oral   Take 1 tablet by mouth every 6 (six) hours as needed for moderate pain or severe pain.   10 tablet   0   . ibuprofen (ADVIL,MOTRIN) 800 MG tablet   Oral   Take 800 mg by mouth every 8 (eight) hours as needed.         . methocarbamol (ROBAXIN-750) 750 MG tablet   Oral   Take 2 tablets (1,500 mg total) by mouth 4 (four) times daily.   40 tablet   0   . naproxen (NAPROSYN) 250 MG tablet   Oral   Take 1,000 mg by mouth 2 (two) times daily as needed for mild pain.         . traMADol (ULTRAM) 50 MG tablet   Oral   Take 1 tablet (50 mg total) by mouth every 6 (six) hours as needed for moderate pain.   12 tablet   0   . Zinc 50 MG TABS   Oral   Take 50 mg by mouth daily.           Allergies Review of patient's allergies indicates no known allergies.  History reviewed. No pertinent family history.  Social History History  Substance Use Topics  . Smoking status: Current Some Day Smoker    Types: Cigarettes  . Smokeless tobacco: Not on file  . Alcohol Use:  Yes    Review of Systems Constitutional: No fever/chills Eyes: No visual changes. ENT: No sore throat. Cardiovascular: Denies chest pain. Respiratory: Denies shortness of breath. Gastrointestinal: No abdominal pain.  No nausea, no vomiting.  No diarrhea.  No constipation. Genitourinary: Negative for dysuria. Musculoskeletal: Negative for back pain. Positive for left lateral neck pain Skin: Negative for rash. Neurological: Negative for headaches, focal weakness or numbness. 10-point ROS otherwise negative.  ____________________________________________   PHYSICAL EXAM:  VITAL SIGNS: ED Triage Vitals  Enc Vitals Group     BP 07/14/14 1620 121/81 mmHg     Pulse Rate 07/14/14 1620 64     Resp 07/14/14 1620 16     Temp 07/14/14 1620 98.3 F (36.8 C)     Temp Source 07/14/14 1620 Oral     SpO2 --      Weight 07/14/14 1620 190 lb (86.183 kg)     Height 07/14/14 1620 5\' 5"  (1.651 m)     Head Cir --      Peak Flow --      Pain Score 07/14/14 1621 10     Pain Loc --      Pain Edu? --  Excl. in GC? --     Constitutional: Alert and oriented. Well appearing and in moderate distress. Eyes: Conjunctivae are normal. PERRL. EOMI. Head: Atraumatic. Nose: No congestion/rhinnorhea. Mouth/Throat: Mucous membranes are moist.  Oropharynx non-erythematous. Neck: No stridor.  No cervical spine guarding,  decreased range of motion right lateral movements. Hematological/Lymphatic/Immunilogical: No cervical lymphadenopathy. Cardiovascular: Normal rate, regular rhythm. Grossly normal heart sounds.  Good peripheral circulation. Respiratory: Normal respiratory effort.  No retractions. Lungs CTAB. Gastrointestinal: Soft and nontender. No distention. No abdominal bruits. No CVA tenderness. Musculoskeletal: No lower extremity tenderness nor edema.  No joint effusions. Neurologic:  Normal speech and language. No gross focal neurologic deficits are appreciated. Speech is normal. No gait  instability. Skin:  Skin is warm, dry and intact. No rash noted. Psychiatric: Mood and affect are normal. Speech and behavior are normal.  ____________________________________________   LABS (all labs ordered are listed, but only abnormal results are displayed)  Labs Reviewed - No data to display ____________________________________________  EKG   ____________________________________________  RADIOLOGY  ____________________________________________   PROCEDURES  Procedure(s) performed: None  Critical Care performed: No  ____________________________________________   INITIAL IMPRESSION / ASSESSMENT AND PLAN / ED COURSE  Pertinent labs & imaging results that were available during my care of the patient were reviewed by me and considered in my medical decision making (see chart for details).  Left lateral neck pain ____________________________________________   FINAL CLINICAL IMPRESSION(S) / ED DIAGNOSES  Final diagnoses:  Left torticollis      Joni ReiningRonald K Smith, PA-C 07/14/14 1759  Minna AntisKevin Paduchowski, MD 07/14/14 2138

## 2014-07-14 NOTE — Discharge Instructions (Signed)
Take medications as directed

## 2014-07-14 NOTE — ED Notes (Signed)
Patient reports neck pain for last three weeks. States pain worsens with movement.

## 2014-12-26 ENCOUNTER — Ambulatory Visit (INDEPENDENT_AMBULATORY_CARE_PROVIDER_SITE_OTHER): Payer: BLUE CROSS/BLUE SHIELD | Admitting: Obstetrics and Gynecology

## 2014-12-26 ENCOUNTER — Encounter: Payer: Self-pay | Admitting: Obstetrics and Gynecology

## 2014-12-26 VITALS — BP 103/67 | HR 69 | Ht 65.0 in | Wt 188.9 lb

## 2014-12-26 DIAGNOSIS — Z01419 Encounter for gynecological examination (general) (routine) without abnormal findings: Secondary | ICD-10-CM | POA: Diagnosis not present

## 2014-12-26 DIAGNOSIS — N946 Dysmenorrhea, unspecified: Secondary | ICD-10-CM | POA: Diagnosis not present

## 2014-12-26 DIAGNOSIS — Z30011 Encounter for initial prescription of contraceptive pills: Secondary | ICD-10-CM | POA: Diagnosis not present

## 2014-12-26 MED ORDER — NORETHIN ACE-ETH ESTRAD-FE 1-20 MG-MCG PO TABS: 1.0000 | ORAL_TABLET | Freq: Every day | ORAL | Status: DC

## 2014-12-26 NOTE — Progress Notes (Signed)
GYNECOLOGY ANNUAL EXAM CLINIC PROGRESS NOTE  Subjective:    Lindsay Roberts is a 29 y.o. P0 female who presents to establish care and for an annual exam. The patient has no complaints today. The patient is sexually active (2 female partners currently). GYN screening history: last pap: patient does not recall when last pap was (~ 10 years ago, was normal). The patient wears seatbelts: yes. The patient participates in regular exercise: no. Has the patient ever been transfused or tattooed?: yes (tattoo). The patient reports that there is not domestic violence in her life.  Contraception: condoms  Menstrual History: OB History    Gravida Para Term Preterm AB TAB SAB Ectopic Multiple Living        Menarche age: 17  Patient's last menstrual period was 11/04/2014. Period Cycle (Days): 6 Period Duration (Days): 5-6 Period Pattern: Regular Menstrual Flow: Heavy Dysmenorrhea: (!) Severe   History reviewed. No pertinent past medical history.  History reviewed. No pertinent family history.  History reviewed. No pertinent past surgical history.  Social History   Social History  . Marital Status: Single    Spouse Name: N/A  . Number of Children: N/A  . Years of Education: N/A   Occupational History  . Not on file.   Social History Main Topics  . Smoking status: Former Smoker    Types: Cigarettes    Quit date: 11/25/2014  . Smokeless tobacco: Not on file  . Alcohol Use: Yes  . Drug Use: No  . Sexual Activity: Yes    Birth Control/ Protection: None   Other Topics Concern  . Not on file   Social History Narrative    No current outpatient prescriptions on file prior to visit.   No current facility-administered medications on file prior to visit.    No Known Allergies    Review of Systems  Constitutional: negative for chills, fatigue, fevers and sweats Eyes: negative for irritation, redness and visual disturbance Ears, nose, mouth, throat, and face:  negative for hearing loss, nasal congestion, snoring and tinnitus Respiratory: negative for asthma, cough, sputum Cardiovascular: negative for chest pain, dyspnea, exertional chest pressure/discomfort, irregular heart beat, palpitations and syncope Gastrointestinal: negative for abdominal pain, change in bowel habits, nausea and vomiting Genitourinary: positive for painful menses; negative for abnormal menstrual periods, genital lesions, sexual problems and vaginal discharge, dysuria and urinary incontinence Integument/breast: negative for breast lump, breast tenderness and nipple discharge Hematologic/lymphatic: negative for bleeding and easy bruising Musculoskeletal:negative for back pain and muscle weakness Neurological: negative for dizziness, headaches, vertigo and weakness Endocrine: negative for diabetic symptoms including polydipsia, polyuria and skin dryness Allergic/Immunologic: negative for hay fever and urticaria    Objective:    BP 103/67 mmHg  Pulse 69  Ht  (1.651 m)  Wt 188 lb 14.4 oz (85.684 kg)  BMI 31.43 kg/m2  LMP 11/04/2014  General Appearance:    Alert, cooperative, no distress, appears stated age  Head:    Normocephalic, without obvious abnormality, atraumatic  Eyes:    PERRL, conjunctiva/corneas clear, EOM's intact, both eyes  Ears:    Normal external ear canals, both ears  Nose:   Nares normal, septum midline, mucosa normal, no drainage or sinus tenderness  Throat:   Lips, mucosa, and tongue normal; teeth and gums normal  Neck:   Supple, symmetrical, trachea midline, no adenopathy; thyroid: No enlargement/tenderness/nodules; no carotid bruit or JVD  Back:     Symmetric, no curvature, ROM  normal, no CVA tenderness  Lungs:     Clear to auscultation bilaterally, respirations unlabored  Chest Wall:    No tenderness or deformity   Heart:    Regular rate and rhythm, S1 and S2 normal, no murmur, rub or gallop  Breast Exam:    No tenderness, masses, or nipple  abnormality  Abdomen:     Soft, non-tender, bowel sounds active all four quadrants, no masses, no organomegaly  Genitalia:    Normal external genitalia without lesion.  Vagina with scant discharge, mucosa normal.  Cervix normal without lesions or discharge. Uterus normal size, shape, consistency.  Adnexae without tenderness or masses.   Rectal:    Normal external sphincter, normal rectal tone, no hemorrhoids.  Internal exam not performed.   Extremities:   Extremities normal, atraumatic, no cyanosis or edema  Pulses:   2+ and symmetric all extremities  Skin:   Skin color, texture, turgor normal, no rashes or lesions  Lymph nodes:   Cervical, supraclavicular, and axillary nodes normal  Neurologic:   CNII-XII intact, normal strength, sensation and reflexes    throughout   Assessment:    Healthy female exam.   Dysmenorrhea   Plan:     Blood tests: Basic metabolic panel and CBC with diff, glucose. Breast self exam technique reviewed and patient encouraged to perform self-exam monthly. Contraception: condoms currently, but would like to begin additional form of contraception. Education given regarding options for contraception, including injectable contraception, IUD placement, oral contraceptives, Nexplanon, Nuva Ring, contraceptive patch. Patient desires OCPs.  To begin on Sunday after next menstrual cycle.  Discussed treatment for dysmenorrhea including OTC pain meds (currently does not take anything).  Initiation of OCPs will also help with dysmenorrhea.  Discussed healthy lifestyle modifications. STD testing offered, performed today. Discussed safe sex practices, continue to encourage condom use  Declines flu vaccine today. Follow up in 1 year.    Hildred LaserAnika Mateus Rewerts, MD [facility]

## 2014-12-27 LAB — CBC
Hematocrit: 38.1 % (ref 34.0–46.6)
Hemoglobin: 12.5 g/dL (ref 11.1–15.9)
MCH: 29 pg (ref 26.6–33.0)
MCHC: 32.8 g/dL (ref 31.5–35.7)
MCV: 88 fL (ref 79–97)
PLATELETS: 296 10*3/uL (ref 150–379)
RBC: 4.31 x10E6/uL (ref 3.77–5.28)
RDW: 14.6 % (ref 12.3–15.4)
WBC: 6 10*3/uL (ref 3.4–10.8)

## 2014-12-27 LAB — BASIC METABOLIC PANEL
BUN/Creatinine Ratio: 16 (ref 8–20)
BUN: 13 mg/dL (ref 6–20)
CO2: 25 mmol/L (ref 18–29)
Calcium: 9.4 mg/dL (ref 8.7–10.2)
Chloride: 99 mmol/L (ref 97–106)
Creatinine, Ser: 0.82 mg/dL (ref 0.57–1.00)
GFR calc Af Amer: 111 mL/min/{1.73_m2} (ref >59)
GFR, EST NON AFRICAN AMERICAN: 96 mL/min/{1.73_m2} (ref >59)
GLUCOSE: 87 mg/dL (ref 65–99)
POTASSIUM: 4.5 mmol/L (ref 3.5–5.2)
Sodium: 137 mmol/L (ref 136–144)

## 2014-12-28 LAB — RPR QUALITATIVE: RPR: NONREACTIVE

## 2014-12-28 LAB — HIV ANTIBODY (ROUTINE TESTING W REFLEX): HIV Screen 4th Generation wRfx: NONREACTIVE

## 2014-12-29 LAB — PAP IG, CT-NG NAA, HPV HIGH-RISK
HPV, HIGH-RISK: NEGATIVE
PAP Smear Comment: 0

## 2014-12-29 LAB — SPECIMEN STATUS REPORT

## 2014-12-29 LAB — HEPATITIS B SURFACE ANTIGEN: HEP B S AG: NEGATIVE

## 2015-01-03 ENCOUNTER — Telehealth: Payer: Self-pay | Admitting: Obstetrics and Gynecology

## 2015-01-03 NOTE — Telephone Encounter (Signed)
Pt is calling for labs results and pap smear

## 2015-01-04 NOTE — Telephone Encounter (Signed)
PT CALLED DURING LUNCH AND STATED SHE CALLED YESTERDAY MORNING WANTING TO KNOW THE RESULTS OF HER BLOOD WORK AND PAP FROM WHEN SHE WAS HRE AT THE BEGINNING OF THE MONTH AND SHEW AS TOLD THAT WE HAVE A 24 HOUR CALL AROUND POLICY AND IT HAS BEEN 24 HOURS AND SHE WOULD REALLY LIKE A CALL BACK TODAY WITH THE RESULTS SO SHE DOES NOT WORRY.  i DID CALL THE PT TO LET HER KNOW I RECEIVED THE MESSAGE SHE LEFT ON OUR VM TO LET HER KNOW THAT THE NURSES WERE OUT TO LUNCH AND I WILL ATTACH ANOTHER MESSAGE AND BEFORE I COULD GET ANOTHER MESSAGE OUT THE PT HUNG UP ON ME.

## 2015-01-05 NOTE — Telephone Encounter (Signed)
-----   Message from Hildred LaserAnika Cherry, MD sent at 01/04/2015 11:07 AM EST ----- Please inform patient that labs are normal (pap and STD testing).  Encourage to sign up for Mychart if she desires to review labs.

## 2015-01-05 NOTE — Telephone Encounter (Signed)
Pt was informed of negative STD testing and PAP.

## 2015-01-25 ENCOUNTER — Ambulatory Visit (INDEPENDENT_AMBULATORY_CARE_PROVIDER_SITE_OTHER): Payer: BLUE CROSS/BLUE SHIELD | Admitting: Obstetrics and Gynecology

## 2015-01-25 ENCOUNTER — Encounter: Payer: Self-pay | Admitting: Obstetrics and Gynecology

## 2015-01-25 VITALS — BP 99/68 | HR 70 | Ht 65.0 in | Wt 192.3 lb

## 2015-01-25 DIAGNOSIS — N926 Irregular menstruation, unspecified: Secondary | ICD-10-CM | POA: Diagnosis not present

## 2015-01-25 DIAGNOSIS — Z3201 Encounter for pregnancy test, result positive: Secondary | ICD-10-CM

## 2015-01-25 LAB — POCT URINE PREGNANCY: PREG TEST UR: POSITIVE — AB

## 2015-01-25 NOTE — Progress Notes (Signed)
GYNECOLOGY CLINIC PROGRESS NOTE  Subjective:    Lindsay ShellerCherita Roberts is a 29 y.o. p0 female who presents for evaluation of amenorrhea. She believes she could be pregnant. Pregnancy is desired. Sexual Activity: single partner, contraception: none (was due to initiate contraception last month but did not start due to absent menses). Current symptoms also include: positive home pregnancy test. Last period was normal.  Patient's last menstrual period was 12/04/2014.   The following portions of the patient's history were reviewed and updated as appropriate: allergies, current medications, past family history, past medical history, past social history, past surgical history and problem list.  Review of Systems A comprehensive review of systems was negative.     Objective:    BP 99/68 mmHg  Pulse 70  Ht 5\' 5"  (1.651 m)  Wt 192 lb 4.8 oz (87.227 kg)  BMI 32.00 kg/m2  LMP 12/04/2014 General: alert, no distress and no acute distress    Lab Review Urine HCG: positive    Assessment:    Absence of menstruation.     Plan:    Pregnancy Test: Positive: EDC: 09/10/2015, with EGA of 7.3 weeks today. Briefly discussed pre-natal care options. Pregnancy, Childbirth and the Newborn book given. Encouraged well-balanced diet, plenty of rest when needed, pre-natal vitamins daily and walking for exercise. Discussed self-help for nausea, avoiding OTC medications until consulting provider or pharmacist, other than Tylenol as needed, minimal caffeine (1-2 cups daily) and avoiding alcohol. She will schedule her initial OB visit in the next month with her PCP or OB provider. Feel free to call with any questions. To be scheduled for viability scan in 1 week with NOB intake.      Hildred LaserAnika Lorynn Moeser, MD [facility]

## 2015-02-08 ENCOUNTER — Ambulatory Visit (INDEPENDENT_AMBULATORY_CARE_PROVIDER_SITE_OTHER): Payer: BLUE CROSS/BLUE SHIELD

## 2015-02-08 ENCOUNTER — Ambulatory Visit (INDEPENDENT_AMBULATORY_CARE_PROVIDER_SITE_OTHER): Payer: BLUE CROSS/BLUE SHIELD | Admitting: Obstetrics and Gynecology

## 2015-02-08 VITALS — BP 101/68 | HR 61 | Wt 196.1 lb

## 2015-02-08 DIAGNOSIS — R638 Other symptoms and signs concerning food and fluid intake: Secondary | ICD-10-CM

## 2015-02-08 DIAGNOSIS — Z369 Encounter for antenatal screening, unspecified: Secondary | ICD-10-CM

## 2015-02-08 DIAGNOSIS — Z1389 Encounter for screening for other disorder: Secondary | ICD-10-CM

## 2015-02-08 DIAGNOSIS — Z113 Encounter for screening for infections with a predominantly sexual mode of transmission: Secondary | ICD-10-CM

## 2015-02-08 DIAGNOSIS — Z331 Pregnant state, incidental: Secondary | ICD-10-CM

## 2015-02-08 DIAGNOSIS — N926 Irregular menstruation, unspecified: Secondary | ICD-10-CM

## 2015-02-08 DIAGNOSIS — Z36 Encounter for antenatal screening of mother: Secondary | ICD-10-CM

## 2015-02-08 DIAGNOSIS — Z3490 Encounter for supervision of normal pregnancy, unspecified, unspecified trimester: Secondary | ICD-10-CM

## 2015-02-08 NOTE — Progress Notes (Signed)
Imagene Shellerherita Mochizuki presents for NOB nurse interview visit. G-1.  P-0.  Ultrasound done for viability and dating. EDD: 09/08/2015. Pregnancy education material explained and given. No cats in the home. NOB labs ordered. TSH/HbgA1c due to Increased BMI, HIV labs and Drug screen were explained optional and she could opt out of tests but did not decline. Drug screen ordered. PNV encouraged. NT to discuss with provider. Pt. To follow up with provider in 3 weeks for NOB physical.  All questions answered. 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  ZIKA EXPOSURE SCREEN:  The patient has not traveled to a [place] endemic area within the past 6 months, nor has she had unprotected sex with a partner who has travelled to a [place] endemic region within the past 6 months. The patient has been advised to notify us if these factors change any time during this current pregnancy, so adequate testing and monitoring can be initiated.

## 2015-02-08 NOTE — Patient Instructions (Signed)

## 2015-02-09 LAB — CBC WITH DIFFERENTIAL/PLATELET
BASOS ABS: 0 10*3/uL (ref 0.0–0.2)
Basos: 0 %
EOS (ABSOLUTE): 0.3 10*3/uL (ref 0.0–0.4)
Eos: 4 %
HEMOGLOBIN: 12.6 g/dL (ref 11.1–15.9)
Hematocrit: 37.5 % (ref 34.0–46.6)
IMMATURE GRANS (ABS): 0 10*3/uL (ref 0.0–0.1)
Immature Granulocytes: 0 %
LYMPHS ABS: 2 10*3/uL (ref 0.7–3.1)
LYMPHS: 28 %
MCH: 29.3 pg (ref 26.6–33.0)
MCHC: 33.6 g/dL (ref 31.5–35.7)
MCV: 87 fL (ref 79–97)
MONOCYTES: 9 %
Monocytes Absolute: 0.6 10*3/uL (ref 0.1–0.9)
Neutrophils Absolute: 4.3 10*3/uL (ref 1.4–7.0)
Neutrophils: 59 %
PLATELETS: 295 10*3/uL (ref 150–379)
RBC: 4.3 x10E6/uL (ref 3.77–5.28)
RDW: 15.7 % — ABNORMAL HIGH (ref 12.3–15.4)
WBC: 7.2 10*3/uL (ref 3.4–10.8)

## 2015-02-09 LAB — URINALYSIS, ROUTINE W REFLEX MICROSCOPIC
Bilirubin, UA: NEGATIVE
Glucose, UA: NEGATIVE
Ketones, UA: NEGATIVE
Leukocytes, UA: NEGATIVE
Nitrite, UA: NEGATIVE
PROTEIN UA: NEGATIVE
RBC, UA: NEGATIVE
Specific Gravity, UA: 1.008 (ref 1.005–1.030)
Urobilinogen, Ur: 0.2 mg/dL (ref 0.2–1.0)
pH, UA: 6.5 (ref 5.0–7.5)

## 2015-02-09 LAB — TSH: TSH: 2.47 u[IU]/mL (ref 0.450–4.500)

## 2015-02-09 LAB — HEMOGLOBIN A1C
Est. average glucose Bld gHb Est-mCnc: 105 mg/dL
Hgb A1c MFr Bld: 5.3 % (ref 4.8–5.6)

## 2015-02-09 LAB — GC/CHLAMYDIA PROBE AMP
Chlamydia trachomatis, NAA: NEGATIVE
NEISSERIA GONORRHOEAE BY PCR: NEGATIVE

## 2015-02-09 LAB — ANTIBODY SCREEN: ANTIBODY SCREEN: NEGATIVE

## 2015-02-09 LAB — RUBELLA ANTIBODY, IGM

## 2015-02-09 LAB — ABO

## 2015-02-09 LAB — SICKLE CELL SCREEN: SICKLE CELL SCREEN: NEGATIVE

## 2015-02-09 LAB — HIV ANTIBODY (ROUTINE TESTING W REFLEX): HIV Screen 4th Generation wRfx: NONREACTIVE

## 2015-02-09 LAB — RH TYPE: Rh Factor: POSITIVE

## 2015-02-09 LAB — RPR: RPR Ser Ql: NONREACTIVE

## 2015-02-09 LAB — HEPATITIS B SURFACE ANTIGEN: HEP B S AG: NEGATIVE

## 2015-02-10 LAB — PAIN MGT SCRN (14 DRUGS), UR
Amphetamine Screen, Ur: NEGATIVE ng/mL
BUPRENORPHINE, URINE: NEGATIVE ng/mL
Barbiturate Screen, Ur: NEGATIVE ng/mL
Benzodiazepine Screen, Urine: NEGATIVE ng/mL
CREATININE(CRT), U: 47.4 mg/dL (ref 20.0–300.0)
Cannabinoids Ur Ql Scn: NEGATIVE ng/mL
Cocaine(Metab.)Screen, Urine: NEGATIVE ng/mL
FENTANYL, URINE: NEGATIVE pg/mL
MEPERIDINE SCREEN, URINE: NEGATIVE ng/mL
Methadone Scn, Ur: NEGATIVE ng/mL
OPIATE SCRN UR: NEGATIVE ng/mL
OXYCODONE+OXYMORPHONE UR QL SCN: NEGATIVE ng/mL
PCP SCRN UR: NEGATIVE ng/mL
PROPOXYPHENE SCREEN: NEGATIVE ng/mL
Ph of Urine: 6 (ref 4.5–8.9)
Tramadol Ur Ql Scn: NEGATIVE ng/mL

## 2015-02-10 LAB — CULTURE, OB URINE

## 2015-02-10 LAB — NICOTINE SCREEN, URINE: Cotinine Ql Scrn, Ur: NEGATIVE ng/mL

## 2015-02-10 LAB — URINE CULTURE, OB REFLEX

## 2015-02-12 LAB — VARICELLA ZOSTER ANTIBODY, IGM: Varicella IgM: 0.91 index — ABNORMAL HIGH (ref 0.00–0.90)

## 2015-03-01 ENCOUNTER — Ambulatory Visit (INDEPENDENT_AMBULATORY_CARE_PROVIDER_SITE_OTHER): Payer: BLUE CROSS/BLUE SHIELD | Admitting: Obstetrics and Gynecology

## 2015-03-01 VITALS — BP 123/71 | HR 70 | Wt 192.2 lb

## 2015-03-01 DIAGNOSIS — O09899 Supervision of other high risk pregnancies, unspecified trimester: Secondary | ICD-10-CM

## 2015-03-01 DIAGNOSIS — Z283 Underimmunization status: Secondary | ICD-10-CM

## 2015-03-01 DIAGNOSIS — Z3481 Encounter for supervision of other normal pregnancy, first trimester: Secondary | ICD-10-CM

## 2015-03-01 DIAGNOSIS — Z3491 Encounter for supervision of normal pregnancy, unspecified, first trimester: Secondary | ICD-10-CM

## 2015-03-01 DIAGNOSIS — O9989 Other specified diseases and conditions complicating pregnancy, childbirth and the puerperium: Secondary | ICD-10-CM

## 2015-03-01 DIAGNOSIS — Z3402 Encounter for supervision of normal first pregnancy, second trimester: Secondary | ICD-10-CM

## 2015-03-01 LAB — POCT URINALYSIS DIPSTICK
Bilirubin, UA: NEGATIVE
Blood, UA: NEGATIVE
GLUCOSE UA: NEGATIVE
KETONES UA: 15
LEUKOCYTES UA: NEGATIVE
NITRITE UA: NEGATIVE
Protein, UA: NEGATIVE
Spec Grav, UA: <=1.005
Urobilinogen, UA: NEGATIVE
pH, UA: 6.5

## 2015-03-02 ENCOUNTER — Encounter: Payer: Self-pay | Admitting: Obstetrics and Gynecology

## 2015-03-05 ENCOUNTER — Encounter: Payer: Self-pay | Admitting: Obstetrics and Gynecology

## 2015-03-05 DIAGNOSIS — Z3491 Encounter for supervision of normal pregnancy, unspecified, first trimester: Secondary | ICD-10-CM | POA: Insufficient documentation

## 2015-03-05 DIAGNOSIS — O09899 Supervision of other high risk pregnancies, unspecified trimester: Secondary | ICD-10-CM | POA: Insufficient documentation

## 2015-03-05 DIAGNOSIS — O9989 Other specified diseases and conditions complicating pregnancy, childbirth and the puerperium: Secondary | ICD-10-CM

## 2015-03-05 DIAGNOSIS — Z283 Underimmunization status: Secondary | ICD-10-CM | POA: Insufficient documentation

## 2015-03-05 MED ORDER — PRENATAL 27-0.8 MG PO TABS: 1.0000 | ORAL_TABLET | Freq: Every day | ORAL | Status: DC

## 2015-03-05 NOTE — Progress Notes (Signed)
INITIAL OBSTETRIC CLINIC VISIT  Subjective:    Lindsay Roberts is being seen today for her first obstetrical visit.  This is not a planned pregnancy. She is a G1P0 at 93w5dgestation, Estimated Date of Delivery: 09/08/15 (by LMP, consistent with 9 week ultrasound). Her obstetrical history is significant for none. Relationship with FOB: unsure, was involved with 2 partners, used condoms. Patient does intend to breast feed. Pregnancy history fully reviewed.  Menstrual History: OB History    Gravida Para Term Preterm AB TAB SAB Ectopic Multiple Living   1               Menarche age: [age] Reports regular menstrual cycles Patient's last menstrual period was 12/04/2014.  Denies h/o abnormal pap smears or STIs.    Past Medical History  Diagnosis Date  . Medical history non-contributory     Past Surgical History  Procedure Laterality Date  . No past surgeries       Family History  Problem Relation Age of Onset  . Rheum arthritis Paternal Grandmother   . Breast cancer Maternal Grandmother 571 . Diabetes Maternal Grandmother   . Heart failure Maternal Grandmother     double by-pass  . Hypertension Maternal Grandmother   . Rashes / Skin problems Sister     eczema  . Seizures Sister   . Lupus Sister     Social History   Social History  . Marital Status: Single    Spouse Name: N/A  . Number of Children: N/A  . Years of Education: N/A   Occupational History  . [employer]   Social History Main Topics  . Smoking status: Former Smoker    Types: Cigarettes    Quit date: 11/25/2014  . Smokeless tobacco: Never Used  . Alcohol Use: No  . Drug Use: Yes    Special: Cocaine, Marijuana     Comment: in the past  . Sexual Activity:    Partners: Male    Birth Control/ Protection: None   Other Topics Concern  . Not on file   Social History Narrative    Current Outpatient Prescriptions on File Prior to Visit  Medication Sig Dispense Refill  . BOOSTRIX 5-2.5-18.5  injection     . Docosahexaenoic Acid (DHA PO) Take by mouth.    .Marland KitchenPNEUMOVAX 23 25 MCG/0.5ML injection     . Prenatal Vit-Fe Fumarate-FA (MULTIVITAMIN-PRENATAL) 27-0.8 MG TABS tablet Take 1 tablet by mouth daily at 12 noon.     No current facility-administered medications on file prior to visit.    No Known Allergies   Review of Systems General:Not Present- Fever, Weight Loss and Weight Gain. Skin:Not Present- Rash. HEENT:Not Present- Blurred Vision, Headache and Bleeding Gums. Respiratory:Not Present- Difficulty Breathing. Breast:Not Present- Breast Mass. Cardiovascular:Not Present- Chest Pain, Elevated Blood Pressure, Fainting / Blacking Out and Shortness of Breath. Gastrointestinal:Not Present- Abdominal Pain, Constipation, Nausea and Vomiting. Female Genitourinary:Present- intermittent pelvic cramping, mild. Not Present- Frequency, Painful Urination, Pelvic Pain, Vaginal Bleeding, Vaginal Discharge, Contractions, regular, Fetal Movements Decreased, Urinary Complaints and Vaginal Fluid. Musculoskeletal:Not Present- Back Pain and Leg Cramps. Neurological:Not Present- Dizziness. Psychiatric:Not Present- Depression.    Objective:    BP 123/71 mmHg  Pulse 70  Wt 192 lb 3.2 oz (87.181 kg)  LMP 12/04/2014    General Appearance:    Alert, cooperative, no distress, appears stated age  Head:    Normocephalic, without obvious abnormality, atraumatic  Eyes:    PERRL, conjunctiva/corneas clear, EOM's intact, both eyes  Ears:    Normal external ear canals, both ears  Nose:   Nares normal, septum midline, mucosa normal, no drainage or sinus tenderness  Throat:   Lips, mucosa, and tongue normal; teeth and gums normal  Neck:   Supple, symmetrical, trachea midline, no adenopathy; thyroid: no enlargement/tenderness/nodules; no carotid bruit or JVD  Back:     Symmetric, no curvature, ROM normal, no CVA tenderness  Lungs:     Clear to auscultation bilaterally, respirations unlabored    Chest Wall:    No tenderness or deformity   Heart:    Regular rate and rhythm, S1 and S2 normal, no murmur, rub or gallop  Breast Exam:    No tenderness, masses, or nipple abnormality  Abdomen:     Soft, non-tender, bowel sounds active all four quadrants, no masses, no organomegaly.  FH 154.  FHT 12  bpm.  Genitalia:    Pelvic:external genitalia normal, vagina without lesions, discharge, or tenderness, rectovaginal septum  normal. Cervix normal in appearance, no cervical motion tenderness, no adnexal masses or tenderness.  Pregnancy positive findings: uterine enlargement: 12 wk size, nontender.   Rectal:    Normal external sphincter.  No hemorrhoids appreciated. Internal exam not done.   Extremities:   Extremities normal, atraumatic, no cyanosis or edema  Pulses:   2+ and symmetric all extremities  Skin:   Skin color, texture, turgor normal, no rashes or lesions  Lymph nodes:   Cervical, supraclavicular, and axillary nodes normal  Neurologic:   CNII-XII intact, normal strength, sensation and reflexes throughout    Assessment:    Pregnancy at 12 and 5/7 weeks   Rubella non-immune   Plan:    Initial labs reviewed. Will need MMR postpartum. Prenatal vitamins encouraged. Problem list reviewed and updated. New OB counseling: The patient has been given an overview regarding routine prenatal care. Recommendations regarding diet, weight gain, and exercise in pregnancy were given. Prenatal testing, optional genetic testing, and ultrasound use in pregnancy were reviewed.  Patient is considering Panorama testing, however is unsure what her insurance will cover.  To contact insurance company regarding testing.  Benefits of Breast Feeding were discussed. The patient is encouraged to consider nursing her baby post partum. Tylenol prn for mild intermittent cramping. Patient received Tdap just prior to discovery of pregnancy. Follow up in 4 weeks.   Rubie Maid, MD [facility]

## 2015-03-29 ENCOUNTER — Encounter: Payer: BLUE CROSS/BLUE SHIELD | Admitting: Obstetrics and Gynecology

## 2015-03-29 ENCOUNTER — Ambulatory Visit (INDEPENDENT_AMBULATORY_CARE_PROVIDER_SITE_OTHER): Payer: BLUE CROSS/BLUE SHIELD | Admitting: Obstetrics and Gynecology

## 2015-03-29 VITALS — BP 102/67 | HR 90 | Wt 191.6 lb

## 2015-03-29 DIAGNOSIS — Z3492 Encounter for supervision of normal pregnancy, unspecified, second trimester: Secondary | ICD-10-CM

## 2015-03-29 DIAGNOSIS — Z3482 Encounter for supervision of other normal pregnancy, second trimester: Secondary | ICD-10-CM

## 2015-03-29 LAB — POCT URINALYSIS DIPSTICK
Bilirubin, UA: NEGATIVE
Glucose, UA: NEGATIVE
KETONES UA: NEGATIVE
LEUKOCYTES UA: NEGATIVE
Nitrite, UA: NEGATIVE
PH UA: 7
PROTEIN UA: NEGATIVE
Spec Grav, UA: 1.01
Urobilinogen, UA: NEGATIVE

## 2015-03-29 NOTE — Progress Notes (Signed)
ROB: Patient notes cramping has significantly improved. Is exercising and modifying diet.  For anatomy scan next visit.  RTC in 4 weeks.

## 2015-04-27 ENCOUNTER — Ambulatory Visit (INDEPENDENT_AMBULATORY_CARE_PROVIDER_SITE_OTHER): Payer: BLUE CROSS/BLUE SHIELD

## 2015-04-27 DIAGNOSIS — Z3482 Encounter for supervision of other normal pregnancy, second trimester: Secondary | ICD-10-CM | POA: Diagnosis not present

## 2015-04-27 DIAGNOSIS — Z3492 Encounter for supervision of normal pregnancy, unspecified, second trimester: Secondary | ICD-10-CM

## 2015-05-01 ENCOUNTER — Encounter: Payer: BLUE CROSS/BLUE SHIELD | Admitting: Obstetrics and Gynecology

## 2015-12-14 ENCOUNTER — Encounter (HOSPITAL_COMMUNITY): Payer: Self-pay

## 2016-05-20 ENCOUNTER — Other Ambulatory Visit (HOSPITAL_COMMUNITY): Payer: Self-pay | Admitting: Obstetrics and Gynecology

## 2016-05-20 DIAGNOSIS — Q639 Congenital malformation of kidney, unspecified: Secondary | ICD-10-CM

## 2016-05-20 DIAGNOSIS — Q513 Bicornate uterus: Secondary | ICD-10-CM

## 2016-05-20 DIAGNOSIS — Q5128 Other doubling of uterus, other specified: Secondary | ICD-10-CM

## 2016-05-20 DIAGNOSIS — Q512 Other doubling of uterus, unspecified: Secondary | ICD-10-CM

## 2016-05-21 ENCOUNTER — Other Ambulatory Visit (HOSPITAL_COMMUNITY): Payer: Self-pay | Admitting: Obstetrics and Gynecology

## 2016-05-21 DIAGNOSIS — Q512 Other doubling of uterus, unspecified: Secondary | ICD-10-CM

## 2016-05-21 DIAGNOSIS — Q639 Congenital malformation of kidney, unspecified: Secondary | ICD-10-CM

## 2016-05-21 DIAGNOSIS — Q5128 Other doubling of uterus, other specified: Secondary | ICD-10-CM

## 2016-05-21 DIAGNOSIS — Q513 Bicornate uterus: Secondary | ICD-10-CM

## 2016-05-27 ENCOUNTER — Ambulatory Visit (HOSPITAL_COMMUNITY): Payer: Self-pay

## 2016-05-27 ENCOUNTER — Other Ambulatory Visit (HOSPITAL_COMMUNITY): Payer: Self-pay

## 2016-05-30 ENCOUNTER — Ambulatory Visit (HOSPITAL_COMMUNITY)
Admission: RE | Admit: 2016-05-30 | Discharge: 2016-05-30 | Disposition: A | Discharge status: Home / Self Care | Payer: BLUE CROSS/BLUE SHIELD | Source: Ambulatory Visit | Attending: Obstetrics and Gynecology | Admitting: Obstetrics and Gynecology

## 2016-05-30 ENCOUNTER — Encounter (HOSPITAL_COMMUNITY): Payer: Self-pay | Admitting: Radiology

## 2016-05-30 DIAGNOSIS — Q513 Bicornate uterus: Secondary | ICD-10-CM | POA: Insufficient documentation

## 2016-05-30 DIAGNOSIS — Z975 Presence of (intrauterine) contraceptive device: Secondary | ICD-10-CM | POA: Diagnosis not present

## 2016-05-30 DIAGNOSIS — Q5128 Other doubling of uterus, other specified: Secondary | ICD-10-CM

## 2016-05-30 DIAGNOSIS — Q639 Congenital malformation of kidney, unspecified: Secondary | ICD-10-CM | POA: Diagnosis not present

## 2016-05-30 DIAGNOSIS — Q512 Other doubling of uterus, unspecified: Secondary | ICD-10-CM

## 2017-12-26 ENCOUNTER — Other Ambulatory Visit: Payer: Self-pay

## 2017-12-26 ENCOUNTER — Encounter (HOSPITAL_COMMUNITY): Payer: Self-pay

## 2017-12-26 ENCOUNTER — Emergency Department (HOSPITAL_COMMUNITY)
Admission: EM | Admit: 2017-12-26 | Discharge: 2017-12-26 | Disposition: A | Discharge status: Home / Self Care | Payer: Self-pay | Attending: Emergency Medicine | Admitting: Emergency Medicine

## 2017-12-26 ENCOUNTER — Emergency Department (HOSPITAL_COMMUNITY): Payer: Self-pay

## 2017-12-26 DIAGNOSIS — M25571 Pain in right ankle and joints of right foot: Secondary | ICD-10-CM | POA: Insufficient documentation

## 2017-12-26 DIAGNOSIS — Z79899 Other long term (current) drug therapy: Secondary | ICD-10-CM | POA: Insufficient documentation

## 2017-12-26 DIAGNOSIS — Z87891 Personal history of nicotine dependence: Secondary | ICD-10-CM | POA: Insufficient documentation

## 2017-12-26 MED ORDER — IBUPROFEN 800 MG PO TABS
800.0000 mg | ORAL_TABLET | Freq: Once | ORAL | Status: AC
  Administered 2017-12-26: 800 mg via ORAL
  Filled 2017-12-26: qty 1

## 2017-12-26 NOTE — Discharge Instructions (Signed)

## 2017-12-26 NOTE — ED Triage Notes (Signed)
PT C/O RIGHT ANKLE PAIN. SHE STS SHE MISSED A STEP COMING OUT OF A MOVIE THEATER AND HEARD A LOUD "POP". DENIES HEAD INJURY.

## 2017-12-26 NOTE — ED Provider Notes (Signed)
[facility]-EMERGENCY DEPT Provider Note   CSN: [id number] Arrival date & time: 12/26/17  0120     History   Chief Complaint Chief Complaint  Patient presents with  . Ankle Pain    RIGHT    HPI Lindsay Roberts is a 32 y.o. female with a hx of no major medical problems presents to the Emergency Department complaining of acute, persistent right ankle pain onset around 1 AM.  Patient reports she [place] when she stumbled off the curb and rolled her right ankle.  She reports the time she heard an associated "pop."  Patient reports she did not fall or hit her head.  No numbness, tingling or weakness in the right foot or leg.  No pain in the knee or hip.  Patient reports she has been able to walk however that does exacerbate her pain significantly.  She reports associated swelling of the right ankle without swelling of the foot, toes or lower leg.  No open wounds.       The history is provided by the patient and medical records. No language interpreter was used.    Past Medical History:  Diagnosis Date  . Medical history non-contributory     Patient Active Problem List   Diagnosis Date Noted  . Supervision of normal pregnancy in first trimester 03/05/2015  . Rubella non-immune status, antepartum 03/05/2015    Past Surgical History:  Procedure Laterality Date  . NO PAST SURGERIES       OB History    Gravida  1   Para      Term      Preterm      AB      Living        SAB      TAB      Ectopic      Multiple      Live Births               Home Medications    Prior to Admission medications   Medication Sig Start Date End Date Taking? Authorizing Provider  ferrous sulfate 325 (65 FE) MG tablet Take 325 mg by mouth daily. 12/10/17  Yes [provider]  sertraline (ZOLOFT) 50 MG tablet Take 50 mg by mouth daily. 12/09/17  Yes [provider]    Family History Family History  Problem  Relation Age of Onset  . Breast cancer Maternal Grandmother 50  . Diabetes Maternal Grandmother   . Heart failure Maternal Grandmother        double by-pass  . Hypertension Maternal Grandmother   . Rheum arthritis Paternal Grandmother   . Rashes / Skin problems Sister        eczema  . Seizures Sister   . Lupus Sister     Social History Social History   Tobacco Use  . Smoking status: Former Smoker    Types: Cigarettes    Last attempt to quit: 11/25/2014    Years since quitting: 3.0  . Smokeless tobacco: Never Used  Substance Use Topics  . Alcohol use: Yes    Alcohol/week: 0.0 standard drinks    Comment: OCCASIONAL  . Drug use: Yes    Types: Cocaine, Marijuana    Comment: in the past     Allergies   Patient has no known allergies.   Review of Systems Review of Systems  Constitutional: Negative for chills and fever.  Gastrointestinal: Negative for nausea and vomiting.  Musculoskeletal: Positive for arthralgias and joint swelling. Negative for back pain, neck pain and neck stiffness.  Skin: Negative for wound.  Neurological: Negative for numbness.  Hematological: Does not bruise/bleed easily.  Psychiatric/Behavioral: The patient is not nervous/anxious.   All other systems reviewed and are negative.    Physical Exam Updated Vital Signs BP 122/82 (BP Location: Left Arm)   Pulse 84   Temp 98.7 F (37.1 C) (Oral)   Resp 15   Ht 5\' 5"  (1.651 m)   Wt 88.5 kg   LMP 11/26/2017 (Exact Date)   SpO2 100%   BMI 32.45 kg/m   Physical Exam  Constitutional: She appears well-developed and well-nourished. No distress.  HENT:  Head: Normocephalic and atraumatic.  Eyes: Conjunctivae are normal.  Neck: Normal range of motion.  Cardiovascular: Normal rate, regular rhythm and intact distal pulses.  Capillary refill < 3 sec  Pulmonary/Chest: Effort normal and breath sounds normal.  Musculoskeletal: She exhibits tenderness. She exhibits no edema.       Right hip: Normal.         Right knee: Normal.       Right ankle: She exhibits swelling and ecchymosis. She exhibits normal range of motion, no deformity, no laceration and normal pulse. Tenderness. Lateral malleolus and medial malleolus tenderness found. Achilles tendon exhibits no pain, no defect and normal Thompson's test results.       Right foot: There is tenderness.       Feet:  Neurological: She is alert. Coordination normal.  Sensation intact to normal touch in the right lower extremity Strength 5/5 in the right lower extremity including flexion extension of the great toe and dorsiflexion, plantarflexion.  Skin: Skin is warm and dry. She is not diaphoretic.  No tenting of the skin  Psychiatric: She has a normal mood and affect.  Nursing note and vitals reviewed.    ED Treatments / Results   Radiology Dg Ankle Complete Right  Result Date: 12/26/2017 CLINICAL DATA:  32 year old female with right foot pain. EXAM: RIGHT ANKLE - COMPLETE 3+ VIEW; RIGHT FOOT COMPLETE - 3+ VIEW COMPARISON:  None. FINDINGS: A bony fragment with sclerotic margin medial to the navicular most likely chronic and representing an os navicularis. An acute fracture is much less likely given absence of overlying soft tissue swelling. Clinical correlation is recommended. No other acute fracture identified. There is no dislocation. The ankle mortise is intact. The soft tissues are unremarkable. IMPRESSION: No definite acute fracture or dislocation. Probable accessory ossicle medial to the navicular. Electronically Signed   By: Elgie Collard M.D.   On: 12/26/2017 02:40   Dg Foot Complete Right  Result Date: 12/26/2017 CLINICAL DATA:  32 year old female with right foot pain. EXAM: RIGHT ANKLE - COMPLETE 3+ VIEW; RIGHT FOOT COMPLETE - 3+ VIEW COMPARISON:  None. FINDINGS: A bony fragment with sclerotic margin medial to the navicular most likely chronic and representing an os navicularis. An acute fracture is much less likely given absence  of overlying soft tissue swelling. Clinical correlation is recommended. No other acute fracture identified. There is no dislocation. The ankle mortise is intact. The soft tissues are unremarkable. IMPRESSION: No definite acute fracture or dislocation. Probable accessory ossicle medial to the navicular. Electronically Signed   By: Elgie Collard M.D.   On: 12/26/2017 02:40    Procedures Procedures (including critical care time)  Medications Ordered in ED Medications  ibuprofen (ADVIL,MOTRIN) tablet 800 mg (800 mg Oral Given 12/26/17 0515)     Initial Impression /  Assessment and Plan / ED Course  I have reviewed the triage vital signs and the nursing notes.  Pertinent labs & imaging results that were available during my care of the patient were reviewed by me and considered in my medical decision making (see chart for details).     Patient X-Ray negative for obvious fracture or dislocation. I personally evaluated these images. Pain managed in ED. Pt advised to follow up with orthopedics if symptoms persist for possibility of missed fracture diagnosis. Patient given brace while in ED, conservative therapy recommended and discussed. Patient will be dc home & is agreeable with above plan.  Final Clinical Impressions(s) / ED Diagnoses   Final diagnoses:  Acute right ankle pain    ED Discharge Orders    None       Mardene Sayer Boyd Kerbs 12/26/17 0521    Molpus, Jonny Ruiz, MD 12/26/17 [phone]

## 2019-03-17 ENCOUNTER — Ambulatory Visit: Payer: BC Managed Care – PPO | Attending: Internal Medicine

## 2019-03-17 DIAGNOSIS — Z20822 Contact with and (suspected) exposure to covid-19: Secondary | ICD-10-CM

## 2019-03-18 LAB — NOVEL CORONAVIRUS, NAA: SARS-CoV-2, NAA: NOT DETECTED

## 2020-05-08 IMAGING — CR DG ANKLE COMPLETE 3+V*R*
3 series · 3 of 3 positions shown · non-contrast
Comparison: None.

CLINICAL DATA: 32-year-old female with right foot pain.

EXAM:
RIGHT ANKLE - COMPLETE 3+ VIEW; RIGHT FOOT COMPLETE - 3+ VIEW

[x ankle ap right]
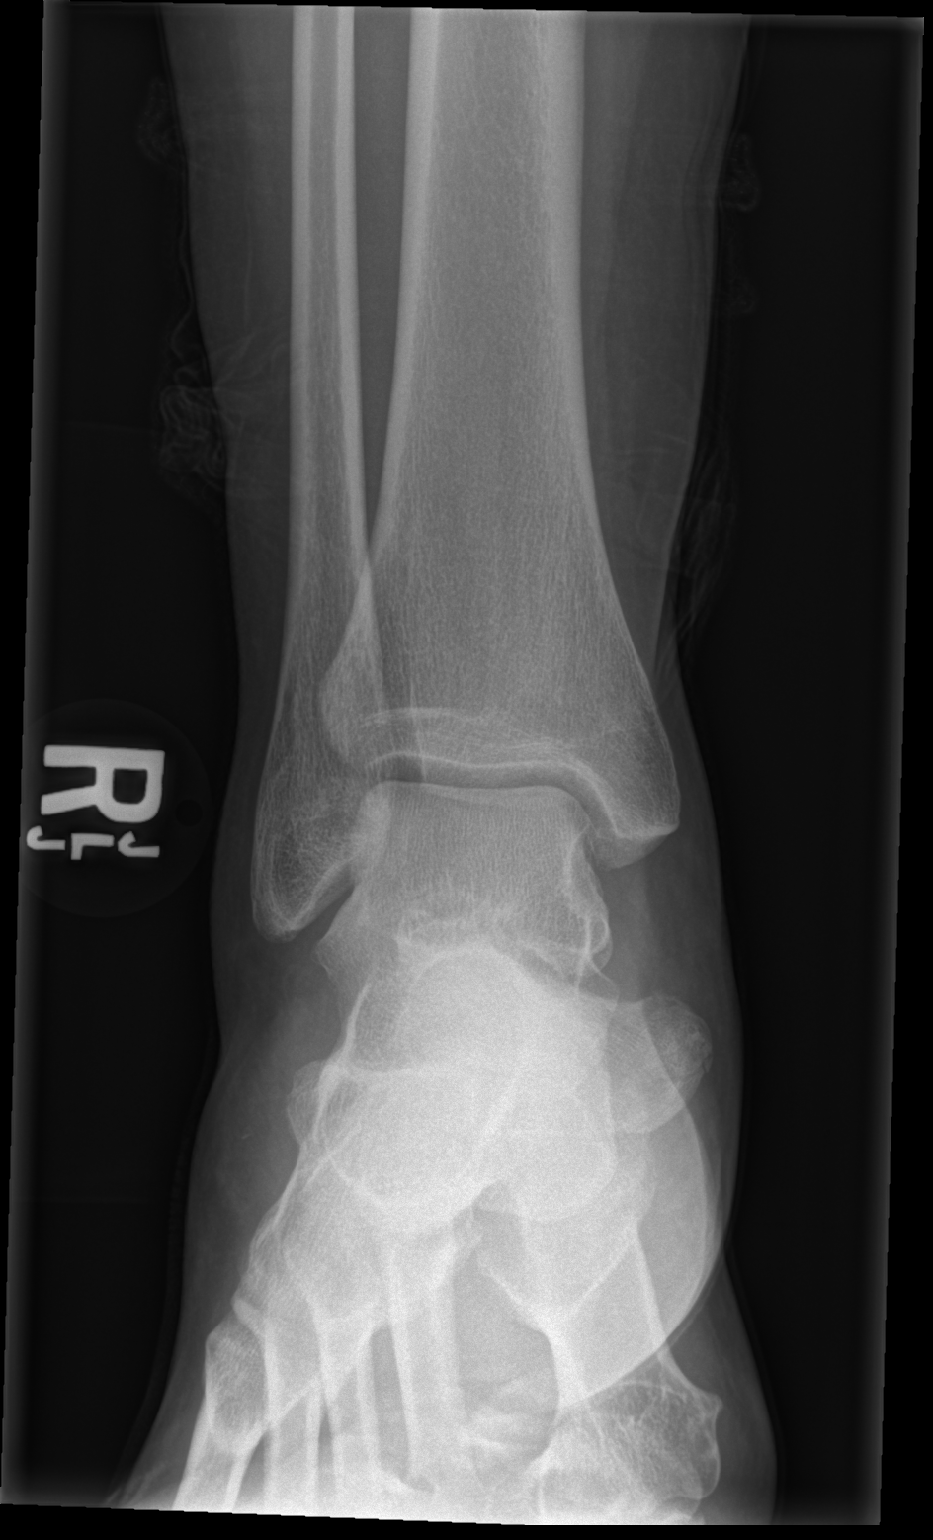

[x ankle obl right]
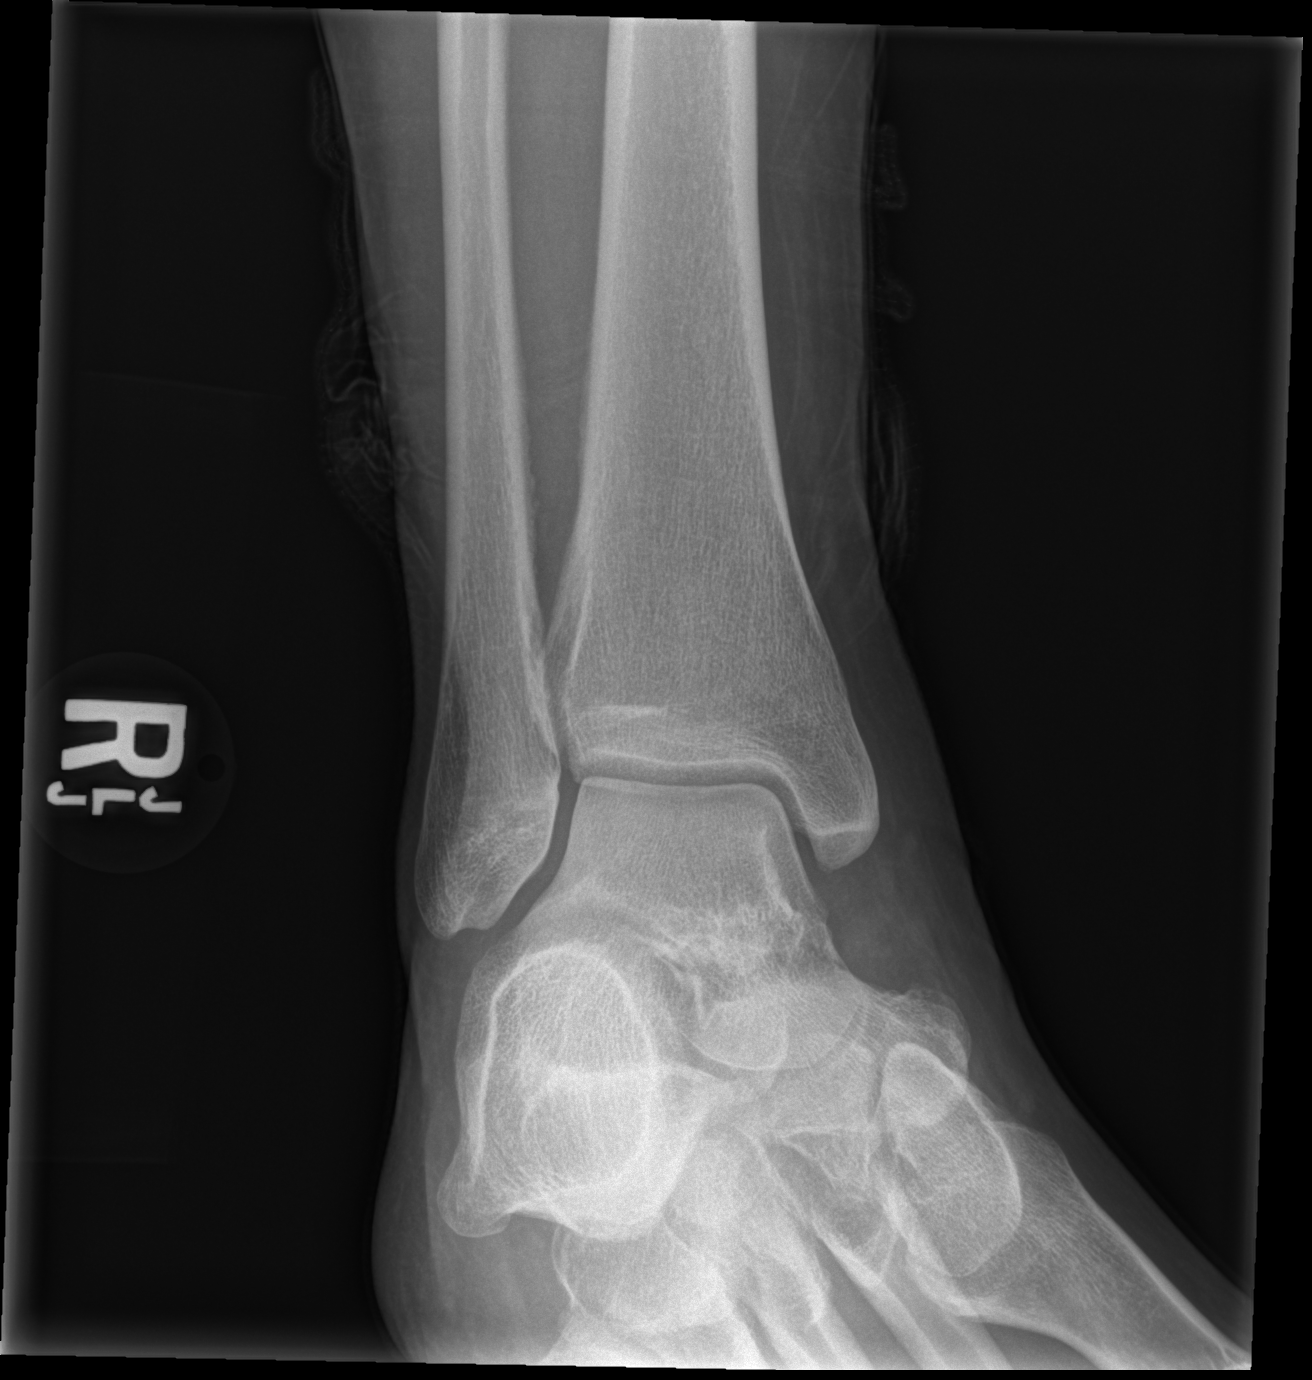

[x ankle lat right]
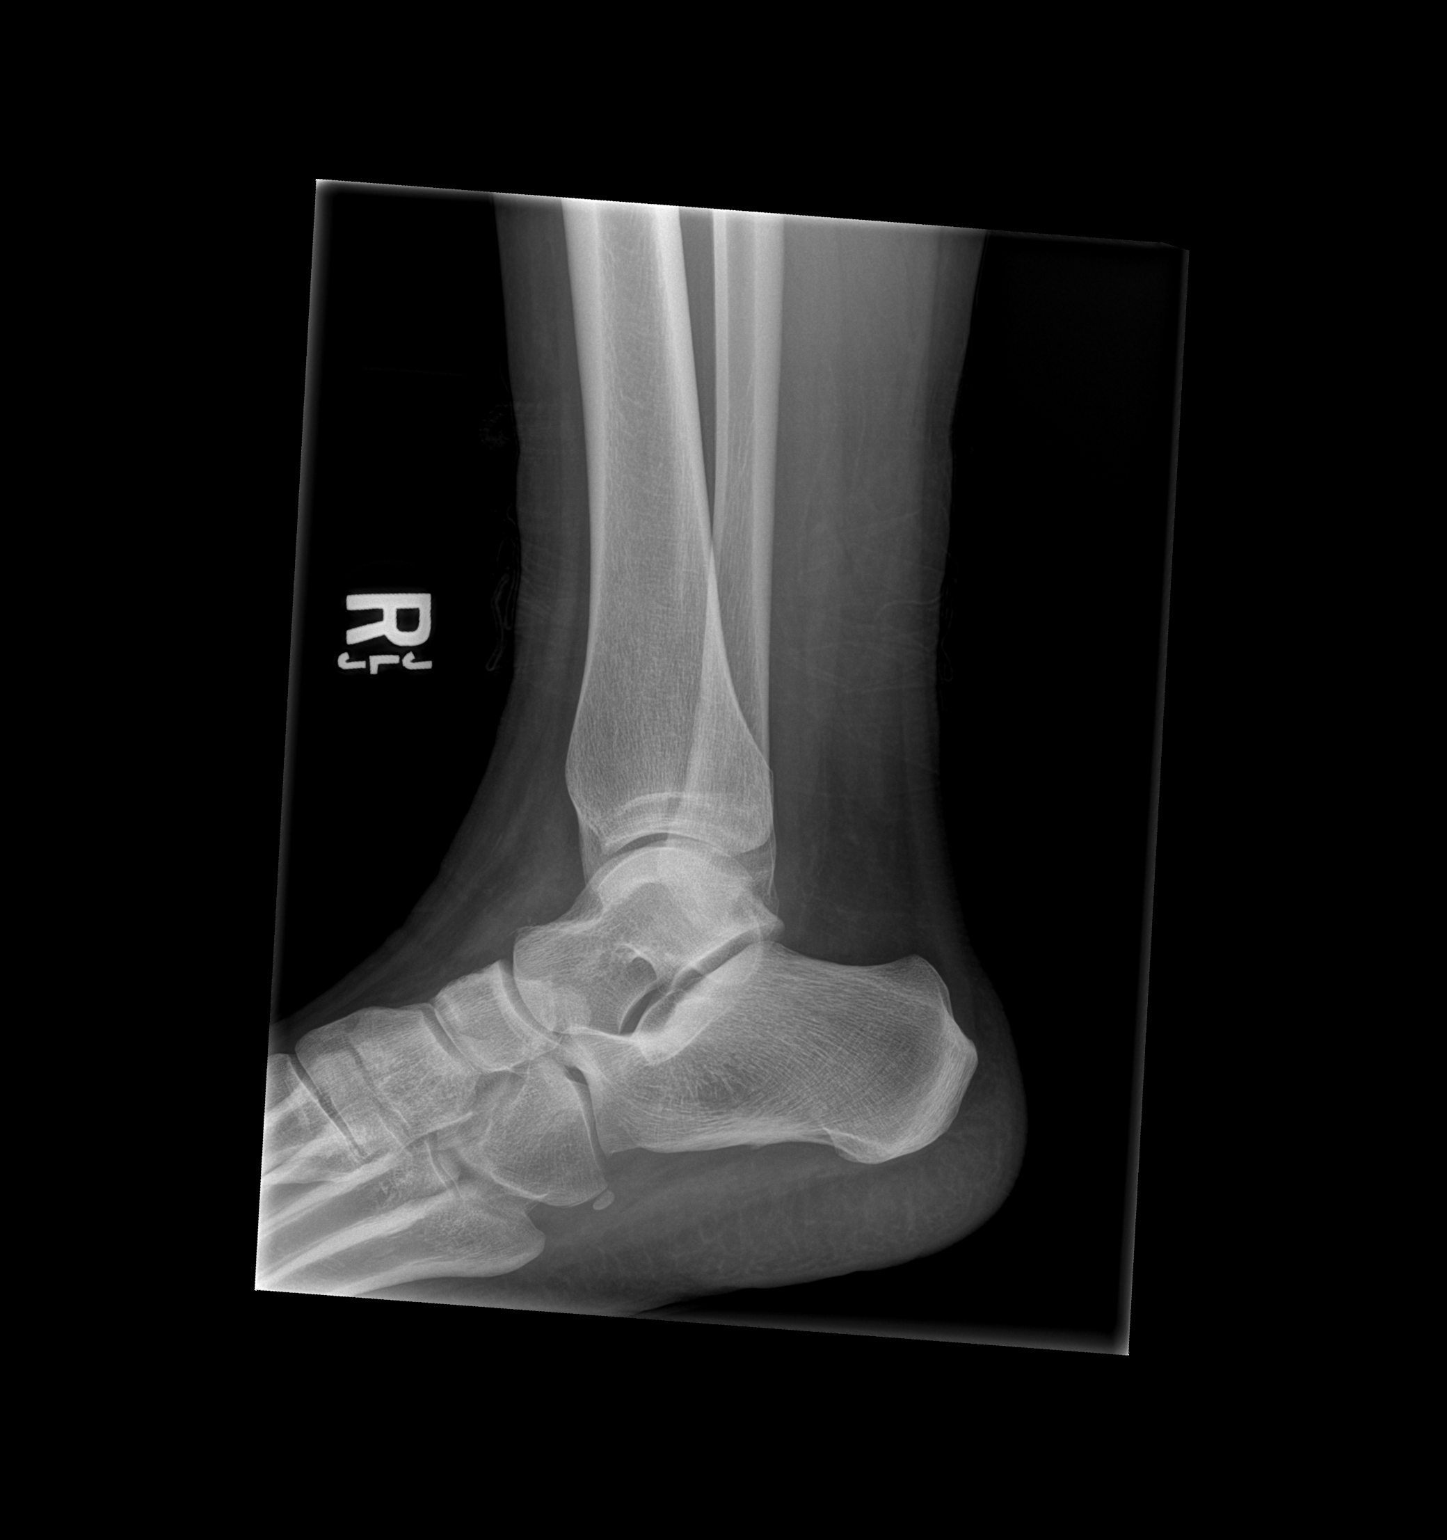

[3 of 3 positions shown; findings below may reference images not displayed]

FINDINGS: A bony fragment with sclerotic margin medial to the navicular most
likely chronic and representing an os navicularis. An acute fracture
is much less likely given absence of overlying soft tissue swelling.
Clinical correlation is recommended. No other acute fracture
identified. There is no dislocation. The ankle mortise is intact.
The soft tissues are unremarkable.
IMPRESSION: No definite acute fracture or dislocation. Probable accessory
ossicle medial to the navicular.

## 2020-05-08 IMAGING — CR DG FOOT COMPLETE 3+V*R*
3 series · 3 of 3 positions shown · non-contrast
Comparison: None.

CLINICAL DATA: 32-year-old female with right foot pain.

EXAM:
RIGHT ANKLE - COMPLETE 3+ VIEW; RIGHT FOOT COMPLETE - 3+ VIEW

[x foot ap right]
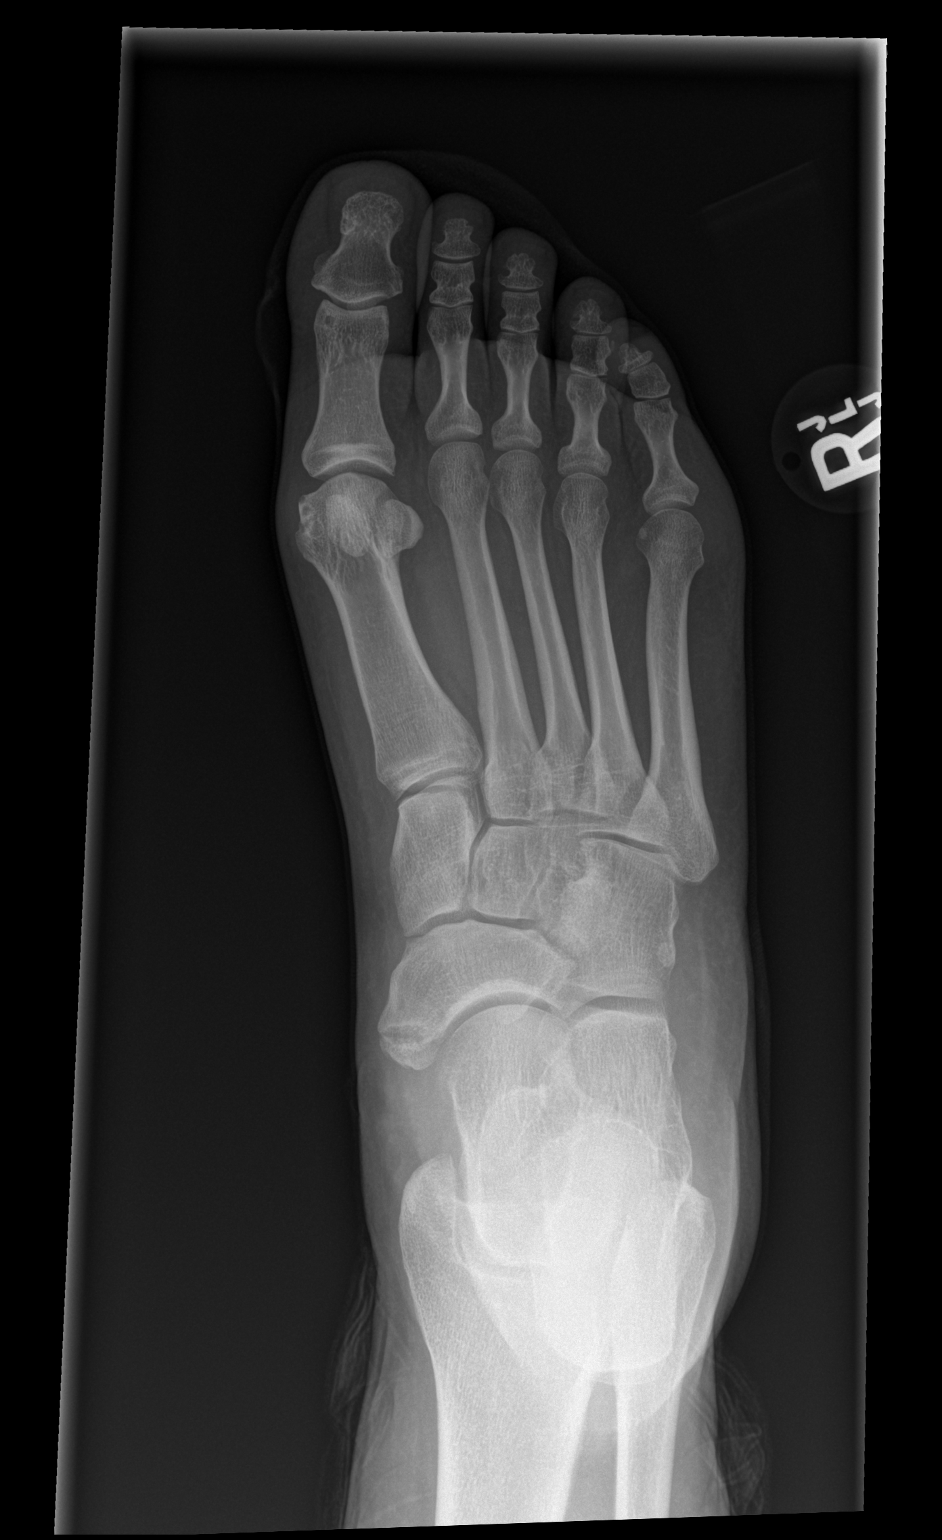

[x foot obl right]
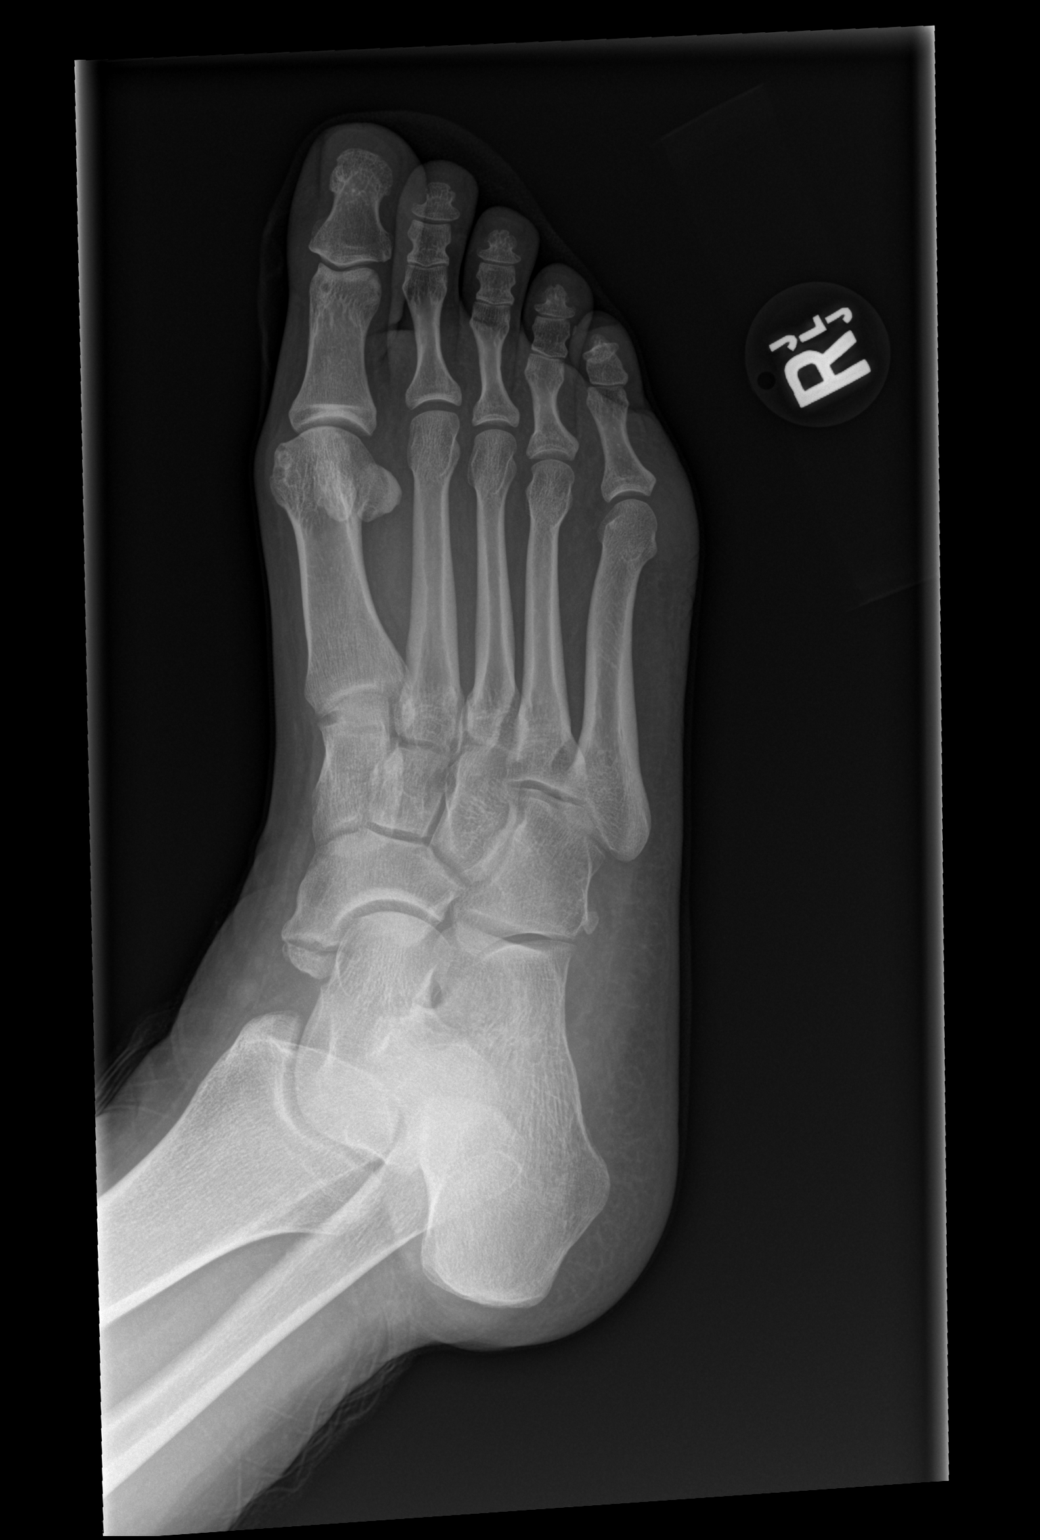

[x foot lat right]
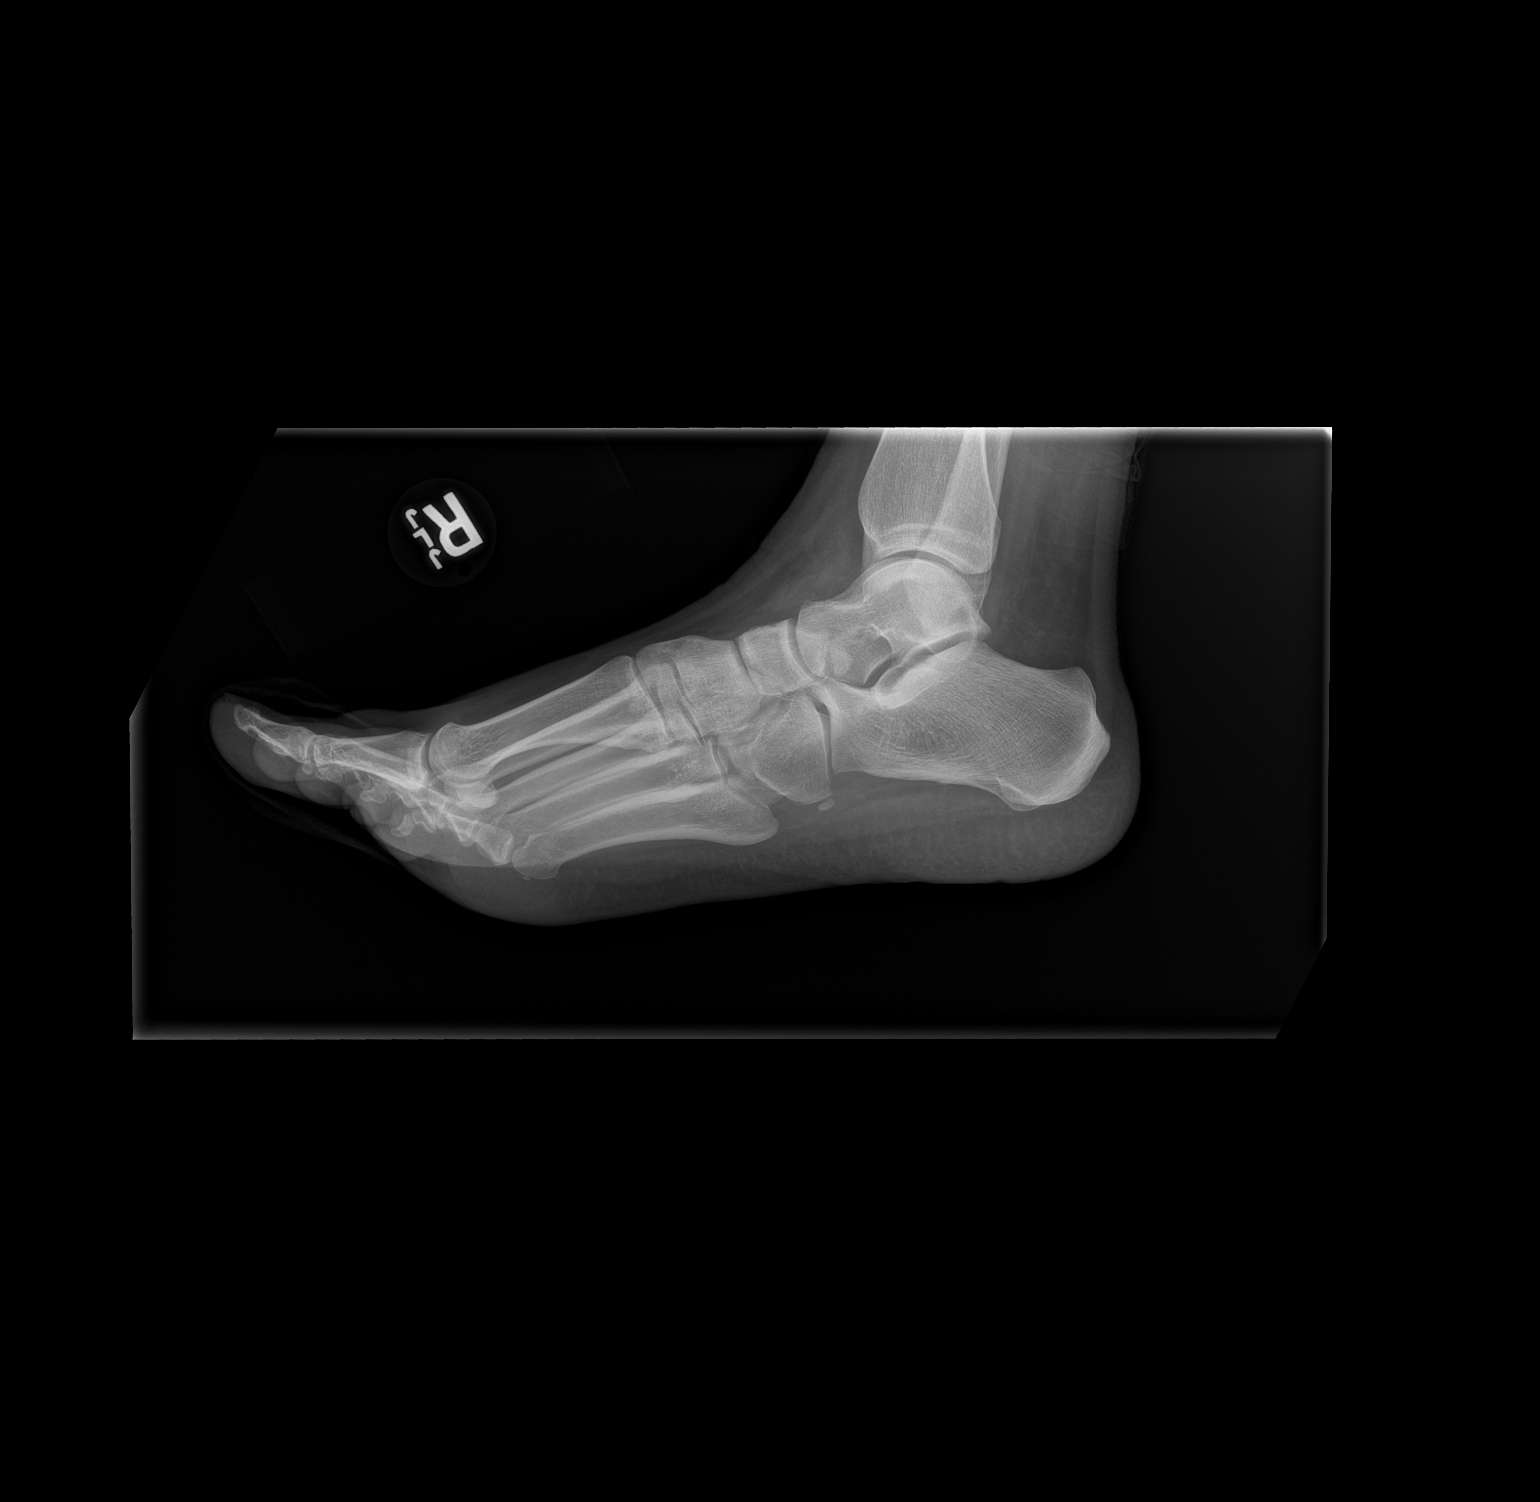

[3 of 3 positions shown; findings below may reference images not displayed]

FINDINGS: A bony fragment with sclerotic margin medial to the navicular most
likely chronic and representing an os navicularis. An acute fracture
is much less likely given absence of overlying soft tissue swelling.
Clinical correlation is recommended. No other acute fracture
identified. There is no dislocation. The ankle mortise is intact.
The soft tissues are unremarkable.
IMPRESSION: No definite acute fracture or dislocation. Probable accessory
ossicle medial to the navicular.

## 2022-01-13 ENCOUNTER — Other Ambulatory Visit: Payer: Self-pay | Admitting: Nurse Practitioner

## 2022-01-13 DIAGNOSIS — R1030 Lower abdominal pain, unspecified: Secondary | ICD-10-CM

## 2022-01-27 ENCOUNTER — Ambulatory Visit
Admission: RE | Admit: 2022-01-27 | Discharge: 2022-01-27 | Disposition: A | Discharge status: Home / Self Care | Payer: Medicaid Other | Source: Ambulatory Visit | Attending: Nurse Practitioner | Admitting: Nurse Practitioner

## 2022-01-27 DIAGNOSIS — R1030 Lower abdominal pain, unspecified: Secondary | ICD-10-CM

## 2022-04-28 ENCOUNTER — Emergency Department (HOSPITAL_BASED_OUTPATIENT_CLINIC_OR_DEPARTMENT_OTHER): Payer: Medicaid Other

## 2022-04-28 ENCOUNTER — Emergency Department (HOSPITAL_BASED_OUTPATIENT_CLINIC_OR_DEPARTMENT_OTHER)
Admission: EM | Admit: 2022-04-28 | Discharge: 2022-04-28 | Disposition: A | Discharge status: Home / Self Care | Payer: Medicaid Other | Attending: Emergency Medicine | Admitting: Emergency Medicine

## 2022-04-28 ENCOUNTER — Encounter (HOSPITAL_BASED_OUTPATIENT_CLINIC_OR_DEPARTMENT_OTHER): Payer: Self-pay | Admitting: Emergency Medicine

## 2022-04-28 ENCOUNTER — Other Ambulatory Visit: Payer: Self-pay

## 2022-04-28 DIAGNOSIS — N939 Abnormal uterine and vaginal bleeding, unspecified: Secondary | ICD-10-CM | POA: Diagnosis not present

## 2022-04-28 DIAGNOSIS — R102 Pelvic and perineal pain: Secondary | ICD-10-CM | POA: Diagnosis present

## 2022-04-28 LAB — CBC WITH DIFFERENTIAL/PLATELET
Abs Immature Granulocytes: 0.01 K/uL (ref 0.00–0.07)
Basophils Absolute: 0 K/uL (ref 0.0–0.1)
Basophils Relative: 1 %
Eosinophils Absolute: 0.1 K/uL (ref 0.0–0.5)
Eosinophils Relative: 1 %
HCT: 35.6 % — ABNORMAL LOW (ref 36.0–46.0)
Hemoglobin: 11.2 g/dL — ABNORMAL LOW (ref 12.0–15.0)
Immature Granulocytes: 0 %
Lymphocytes Relative: 38 %
Lymphs Abs: 2.4 K/uL (ref 0.7–4.0)
MCH: 24.6 pg — ABNORMAL LOW (ref 26.0–34.0)
MCHC: 31.5 g/dL (ref 30.0–36.0)
MCV: 78.1 fL — ABNORMAL LOW (ref 80.0–100.0)
Monocytes Absolute: 0.4 K/uL (ref 0.1–1.0)
Monocytes Relative: 6 %
Neutro Abs: 3.4 K/uL (ref 1.7–7.7)
Neutrophils Relative %: 54 %
Platelets: 326 K/uL (ref 150–400)
RBC: 4.56 MIL/uL (ref 3.87–5.11)
RDW: 21.1 % — ABNORMAL HIGH (ref 11.5–15.5)
WBC: 6.4 K/uL (ref 4.0–10.5)
nRBC: 0 % (ref 0.0–0.2)

## 2022-04-28 LAB — COMPREHENSIVE METABOLIC PANEL
ALT: 8 U/L (ref 0–44)
AST: 14 U/L — ABNORMAL LOW (ref 15–41)
Albumin: 3.9 g/dL (ref 3.5–5.0)
Alkaline Phosphatase: 62 U/L (ref 38–126)
Anion gap: 7 (ref 5–15)
BUN: 7 mg/dL (ref 6–20)
CO2: 27 mmol/L (ref 22–32)
Calcium: 8.8 mg/dL — ABNORMAL LOW (ref 8.9–10.3)
Chloride: 104 mmol/L (ref 98–111)
Creatinine, Ser: 0.94 mg/dL (ref 0.44–1.00)
GFR, Estimated: >60 mL/min (ref >60)
Glucose, Bld: 83 mg/dL (ref 70–99)
Potassium: 3.7 mmol/L (ref 3.5–5.1)
Sodium: 138 mmol/L (ref 135–145)
Total Bilirubin: 0.4 mg/dL (ref 0.3–1.2)
Total Protein: 7.9 g/dL (ref 6.5–8.1)

## 2022-04-28 LAB — URINALYSIS, ROUTINE W REFLEX MICROSCOPIC
Bacteria, UA: NONE SEEN
Bilirubin Urine: NEGATIVE
Glucose, UA: NEGATIVE mg/dL
Ketones, ur: NEGATIVE mg/dL
Nitrite: NEGATIVE
RBC / HPF: >50 RBC/hpf (ref 0–5)
Specific Gravity, Urine: 1.013 (ref 1.005–1.030)
pH: 6 (ref 5.0–8.0)

## 2022-04-28 LAB — HCG, SERUM, QUALITATIVE: Preg, Serum: NEGATIVE

## 2022-04-28 MED ORDER — ACETAMINOPHEN 500 MG PO TABS
1000.0000 mg | ORAL_TABLET | Freq: Once | ORAL | Status: AC
  Administered 2022-04-28: 1000 mg via ORAL

## 2022-04-28 MED ORDER — MORPHINE SULFATE (PF) 2 MG/ML IV SOLN
2.0000 mg | Freq: Once | INTRAVENOUS | Status: AC
  Administered 2022-04-28: 2 mg via INTRAVENOUS
  Filled 2022-04-28: qty 1

## 2022-04-28 NOTE — ED Triage Notes (Signed)
Pt arrives to ED with c/o right sided pelvic pain x4 days. Notes she on her menstrual cycle now and has past hx of ovarian cyst.

## 2022-04-28 NOTE — ED Notes (Signed)
Pt c/o no pain. DC instructions reviewed and AVS given. Education given to take Tylenol for pain and not to exceed 4g in 24 hours. PIV removed x1. Opportunities given for questioning and pt verbalized understanding. Leanne Chang , RN

## 2022-04-28 NOTE — ED Provider Notes (Signed)
[facility] Provider Note   CSN: [id number] Arrival date & time: 04/28/22  [id number]     History  Chief Complaint  Patient presents with   Pelvic Pain    Lindsay Roberts is a 37 y.o. female history of right ovarian cyst presented today with right pelvic pain that has been present for 5 days and vaginal bleeding that began yesterday.  Patient states her menses started last Thursday however the right-sided pelvic pain is worse than her usual cramping and she is concerned for right ovary.  Patient is taking Tylenol and ibuprofen which helps a little but pain has persisted.  Patient states pain does radiated down her right thigh and comes in waves.  Patient has been unable to sleep due to pain.  Patient states this morning she was having nonbloody emesis due to pain.  Patient does not believe she is pregnant.  Patient denies chest pain, shortness of breath, syncope, fevers, loss sensation/motor skills, abdominal pain, flank pain  Home Medications Prior to Admission medications   Medication Sig Start Date End Date Taking? Authorizing Provider  ferrous sulfate 325 (65 FE) MG tablet Take 325 mg by mouth daily. 12/10/17   [provider]  sertraline (ZOLOFT) 50 MG tablet Take 50 mg by mouth daily. 12/09/17   [provider]      Allergies    Patient has no known allergies.    Review of Systems   Review of Systems  Genitourinary:  Positive for pelvic pain.  See HPI  Physical Exam Updated Vital Signs BP 118/83 (BP Location: Right Arm)   Pulse 62   Temp 98.4 F (36.9 C) (Oral)   Resp 16   Ht '5\' 5"'$  (1.651 m)   Wt 108.9 kg   SpO2 100%   BMI 39.94 kg/m  Physical Exam Vitals and nursing note reviewed. Exam conducted with a chaperone present.  Constitutional:      General: She is not in acute distress.    Appearance: She is well-developed.  HENT:     Head: Normocephalic and atraumatic.  Eyes:     Extraocular Movements:  Extraocular movements intact.     Conjunctiva/sclera: Conjunctivae normal.     Pupils: Pupils are equal, round, and reactive to light.  Cardiovascular:     Rate and Rhythm: Normal rate and regular rhythm.     Pulses: Normal pulses.     Heart sounds: Normal heart sounds. No murmur heard.    Comments: 2+ bilateral radial/dorsalis pedis pulses with regular rate Pulmonary:     Effort: Pulmonary effort is normal. No respiratory distress.     Breath sounds: Normal breath sounds.  Abdominal:     Palpations: Abdomen is soft.     Tenderness: There is no abdominal tenderness. There is no right CVA tenderness, left CVA tenderness, guarding or rebound. Negative signs include Rovsing's sign, McBurney's sign and psoas sign.  Genitourinary:    Exam position: Lithotomy position.     Labia:        Right: No tenderness, lesion or injury.        Left: No tenderness, lesion or injury.      Vagina: Normal.     Cervix: Cervical bleeding present. No cervical motion tenderness, discharge, lesion or erythema.     Uterus: Normal.      Adnexa:        Right: Tenderness present. No mass or fullness.         Left: No mass, tenderness  or fullness.       Comments: Chaperone: Leonidas Romberg, RN Cervical os closed Musculoskeletal:        General: No swelling. Normal range of motion.     Cervical back: Normal range of motion and neck supple.     Comments: 5 out of 5 bilateral grip/plantarflexion/dorsiflexion  Lymphadenopathy:     Lower Body: No right inguinal adenopathy. No left inguinal adenopathy.  Skin:    General: Skin is warm and dry.     Capillary Refill: Capillary refill takes less than 2 seconds.  Neurological:     General: No focal deficit present.     Mental Status: She is alert and oriented to person, place, and time.     Comments: Sensation intact in all 4 limbs  Psychiatric:        Mood and Affect: Mood normal.     ED Results / Procedures / Treatments   Labs (all labs ordered are listed, but  only abnormal results are displayed) Labs Reviewed  COMPREHENSIVE METABOLIC PANEL - Abnormal; Notable for the following components:      Result Value   Calcium 8.8 (*)    AST 14 (*)    All other components within normal limits  CBC WITH DIFFERENTIAL/PLATELET - Abnormal; Notable for the following components:   Hemoglobin 11.2 (*)    HCT 35.6 (*)    MCV 78.1 (*)    MCH 24.6 (*)    RDW 21.1 (*)    All other components within normal limits  URINALYSIS, ROUTINE W REFLEX MICROSCOPIC - Abnormal; Notable for the following components:   Color, Urine BROWN (*)    APPearance HAZY (*)    Hgb urine dipstick LARGE (*)    Protein, ur TRACE (*)    Leukocytes,Ua SMALL (*)    All other components within normal limits  HCG, SERUM, QUALITATIVE    EKG None  Radiology US PELVIC COMPLETE W TRANSVAGINAL AND TORSION R/O  Result Date: 04/28/2022 CLINICAL DATA:  Pelvic pain in female. EXAM: TRANSABDOMINAL AND TRANSVAGINAL ULTRASOUND OF PELVIS DOPPLER ULTRASOUND OF OVARIES TECHNIQUE: Both transabdominal and transvaginal ultrasound examinations of the pelvis were performed. Transabdominal technique was performed for global imaging of the pelvis including uterus, ovaries, adnexal regions, and pelvic cul-de-sac. It was necessary to proceed with endovaginal exam following the transabdominal exam to visualize the ovaries and endometrium. Color and duplex Doppler ultrasound was utilized to evaluate blood flow to the ovaries. COMPARISON:  01/27/2022 FINDINGS: Uterus Measurements: 9.4 x 4.5 x 5.6 cm = volume: 124 mL. No fibroids or other mass visualized. Endometrium Thickness: 1.0 cm.  No focal abnormality visualized. Right ovary Measurements: 2.1 x 1.7 x 1.4 cm = volume: 2.6 mL. Normal appearance/no adnexal mass. Left ovary Measurements: 2.6 x 1.6 x 1.8 cm = volume: 4.0 mL. Normal appearance/no adnexal mass. Pulsed Doppler evaluation of both ovaries demonstrates normal low-resistance arterial and venous waveforms in the  left ovary. Limited evaluation of the right ovary. Other findings No abnormal free fluid. IMPRESSION: 1. Normal pelvic ultrasound. 2. Limited Doppler images of the right ovary. However, right ovary has normal morphology and no evidence to suggest acute abnormality in this area. Electronically Signed   By: Markus Daft M.D.   On: 04/28/2022 10:14    Procedures Procedures    Medications Ordered in ED Medications  acetaminophen (TYLENOL) tablet 1,000 mg (1,000 mg Oral Given 04/28/22 0941)  morphine (PF) 2 MG/ML injection 2 mg (2 mg Intravenous Given 04/28/22 1023)    ED  Course/ Medical Decision Making/ A&P                             Medical Decision Making Amount and/or Complexity of Data Reviewed Labs: ordered. Radiology: ordered.  Risk OTC drugs. Prescription drug management.   Harlie Valere Dross 37 y.o. presented today for right pelvic pain. Working DDx that I considered at this time includes, but not limited to, menses, ovarian torsion, ectopic pregnancy, ovarian cyst, MSK, UTI/PID, appendicitis.  Review of prior external notes: None  Unique Tests and My Interpretation:  Pregnancy serum: Negative CBC with differential: Unremarkable UA: Unremarkable [id number] Pelvic ultrasound:Unremarkable  Discussion with Independent Historian: None  Discussion of Management of Tests: None  Risk: Low:  - based on diagnostic testing/clinical impression and treatment plan  Risk Stratification Score: None  R/o DDx: Appendicitis: Patient had unremarkable physical exam with no tenderness in the right lower quadrant and does not have history of fevers Ovarian torsion/cyst: Ultrasound negative UTI: UA did show blood and leukocytes however she is on her menses PID: No cervical motion tenderness Ectopic pregnancy: Pregnancy test negative  Plan: Patient presented for right pelvic pain.  On exam patient was tearful however did not appear to be in distress and has stable vitals.  Patient had  largely unremarkable initial physical exam and so labs will be ordered along with pelvic exam to evaluate further.  Patient be given Tylenol as she took Advil this morning at 0500 for symptom management.  At this time patient is stable.  Patient stated on the pelvic exam she did not want to be tested for STDs as she has not had intercourse for 6 months and does not believe that this is the cause.  I spoke with the patient about risk and benefit of not doing the test and patient had full decision-making capacity and assume the risks.  Patient had unremarkable physical exam and along with labs and ultrasound.  At this time patient is stable for discharge with outpatient follow-up with her primary care provider in the women's health clinic.  Patient was given morphine for symptom management after the Tylenol did not alleviate symptoms.  Morphine helped symptoms.  I spoke with the patient about return precautions and patient seems stable for discharge at this time.  I do not suspect any life-threatening diagnoses at this time.  Patient was given return precautions.patient stable for discharge at this time.  Patient verbalized understanding of plan.         Final Clinical Impression(s) / ED Diagnoses Final diagnoses:  Pelvic pain    Rx / DC Orders ED Discharge Orders     None         Elvina Sidle 04/28/22 1208    Regan Lemming, MD 04/28/22 2012

## 2022-04-28 NOTE — ED Notes (Signed)
Pt aware of the need for a urine... Unable to currently.Marland KitchenMarland Kitchen

## 2022-04-28 NOTE — Discharge Instructions (Addendum)
Please make an appointment with your primary care provider to be seen regarding recent symptoms and ER visit.  I have also attached the women's health clinic for you to follow-up with as well.  Your ultrasound today did not show any new or abnormal findings on your ovaries.  Please continue taking your Advil and Tylenol as needed for your pain and monitor your symptoms.  If symptoms worsen please return to ER.

## 2022-05-28 ENCOUNTER — Ambulatory Visit: Payer: Medicaid Other | Admitting: Obstetrics and Gynecology

## 2022-05-28 ENCOUNTER — Encounter: Payer: Self-pay | Admitting: Obstetrics and Gynecology

## 2022-05-28 ENCOUNTER — Other Ambulatory Visit: Payer: Self-pay

## 2022-05-28 VITALS — BP 127/91 | HR 68 | Wt 239.0 lb

## 2022-05-28 DIAGNOSIS — M791 Myalgia, unspecified site: Secondary | ICD-10-CM | POA: Diagnosis not present

## 2022-05-28 DIAGNOSIS — R102 Pelvic and perineal pain: Secondary | ICD-10-CM | POA: Diagnosis not present

## 2022-05-28 MED ORDER — CYCLOBENZAPRINE HCL 10 MG PO TABS: 10.0000 mg | ORAL_TABLET | Freq: Three times a day (TID) | ORAL | 1 refills | Status: AC | PRN

## 2022-05-28 MED ORDER — CYCLOBENZAPRINE HCL 10 MG PO TABS: 10.0000 mg | ORAL_TABLET | Freq: Three times a day (TID) | ORAL | 1 refills | Status: DC | PRN

## 2022-05-28 NOTE — Patient Instructions (Addendum)
Start with a half of pill for pain and see how sleepy you are. You can take 1/2 to a whole pill up to 3 times a day for pain.  I have placed a referral to pelvic physical therapy

## 2022-05-28 NOTE — Addendum Note (Signed)
Addended by: Cindi Carbon on: 05/28/2022 01:59 PM   Modules accepted: Orders

## 2022-05-28 NOTE — Progress Notes (Signed)
NEW GYNECOLOGY PATIENT Patient name: Lindsay Roberts MRN [id number]  Date of birth: 18-Dec-1985 Chief Complaint:   Gynecologic Exam     History:  Lindsay Roberts is a 37 y.o. G1P0 being seen today for pelvic pain, right sided w/ history of ovarian cyst. ED visit one month ago. Dull pain all the time and worsens with her menses with no changes in her actual menses. Pain started in December. NSAIDs q4-6 hrs; waves of intense pain. No different pain with other activities.   Typically has some pain with menses, no pain with intercourse/masturbation. No other trigger for the pain . Noted to first have some dull pain but not toing any activity outsid eof normal that would caused. Consistently one side. Massaging the area will lessen the pain Chronic pain issues: none Will feel radiation in lower back, hip and thigh This most recent cycle felt pain in periemum and felt like pushing in perineum  Chills intermittently and nausea and sweating with the start of menses No abnormal vaginal discharge  No bleeding between menses Ibuprofen, tylenol, aleve, midol all tried for pain  Pain with intercourse where scar from childbirth (only NSVD, no cesarean or other abdominal surgeries)      Gynecologic History Patient's last menstrual period was 05/24/2022 (exact date). Contraception: none Last Pap: last on file 2016, reports has had pap since  Last Mammogram: n/a Last Colonoscopy: n/a  Obstetric History OB History  Gravida Para Term Preterm AB Living  1            SAB IAB Ectopic Multiple Live Births               # Outcome Date GA Lbr Len/2nd Weight Sex Delivery Anes PTL Lv  1 Gravida             Past Medical History:  Diagnosis Date   Medical history non-contributory     Past Surgical History:  Procedure Laterality Date   NO PAST SURGERIES      Current Outpatient Medications on File Prior to Visit  Medication Sig Dispense Refill   Multiple Vitamin (DAILY VALUE MULTIVITAMIN) TABS       ferrous sulfate 325 (65 FE) MG tablet Take 325 mg by mouth daily. (Patient not taking: Reported on 05/28/2022)  0   sertraline (ZOLOFT) 50 MG tablet Take 50 mg by mouth daily. (Patient not taking: Reported on 05/28/2022)  5   No current facility-administered medications on file prior to visit.    No Known Allergies  Social History:  reports that she quit smoking about 7 years ago. Her smoking use included cigarettes. She has never used smokeless tobacco. She reports current alcohol use. She reports current drug use. Drugs: Cocaine and Marijuana.  Family History  Problem Relation Age of Onset   Breast cancer Maternal Grandmother 45   Diabetes Maternal Grandmother    Heart failure Maternal Grandmother        double by-pass   Hypertension Maternal Grandmother    Rheum arthritis Paternal Grandmother    Rashes / Skin problems Sister        eczema   Seizures Sister    Lupus Sister     The following portions of the patient's history were reviewed and updated as appropriate: allergies, current medications, past family history, past medical history, past social history, past surgical history and problem list.  Review of Systems Pertinent items noted in HPI and remainder of comprehensive ROS otherwise negative.  Physical Exam:  BP (!) 127/91  Pulse 68   Wt 239 lb (108.4 kg)   LMP 05/24/2022 (Exact Date)   BMI 39.77 kg/m  Physical Exam Vitals and nursing note reviewed. Exam conducted with a chaperone present.  Constitutional:      Appearance: Normal appearance.  Cardiovascular:     Rate and Rhythm: Normal rate.  Pulmonary:     Effort: Pulmonary effort is normal.     Breath sounds: Normal breath sounds.  Abdominal:     Palpations: Abdomen is soft.     Tenderness: There is abdominal tenderness.       Comments: No allodynia Mild left sided tenderness with equivocal carnett RLQ tenderness with + carnett   Genitourinary:    Comments: Normal appearing vulva Normal vulvar sensation  bilaterally Nontender superficial pelvic floor muscles Nontender ischial tuberosities bilaterally  Allodynia at introitus: No  Anal wink present Posterior vaginal wall nontender Right levator ani not rated, tender Right ischiococcygeous 1/10 Right obturator internus 1/10 Left levator ani 2/10 Left ischioccocygeous 1/10 Left obturator internus 1/10 Anterior vaginal wall nontender Uterine tenderness nontender   Neurological:     General: No focal deficit present.     Mental Status: She is alert and oriented to person, place, and time.  Psychiatric:        Mood and Affect: Mood normal.        Behavior: Behavior normal.        Thought Content: Thought content normal.        Judgment: Judgment normal.       Assessment and Plan:   1. Pelvic pain - cyclobenzaprine (FLEXERIL) 10 MG tablet; Take 1 tablet (10 mg total) by mouth every 8 (eight) hours as needed for muscle spasms.  Dispense: 30 tablet; Refill: 1 - Ambulatory referral to Physical Therapy  2. Myalgia Hx and exam consistent with MSK pain. Unclear initial trigger for pain. Recommend trial of muscle relaxer with pelvic physical therapy. Reviewed precaution of drowsiness with medication. Re-evaluate symptoms in ~2 months.  - cyclobenzaprine (FLEXERIL) 10 MG tablet; Take 1 tablet (10 mg total) by mouth every 8 (eight) hours as needed for muscle spasms.  Dispense: 30 tablet; Refill: 1 - Ambulatory referral to Physical Therapy    Routine preventative health maintenance measures emphasized. Please refer to After Visit Summary for other counseling recommendations.   Follow-up: No follow-ups on file.      Darliss Cheney, MD Obstetrician & Gynecologist, [facility] [employer], [place]

## 2022-06-27 ENCOUNTER — Ambulatory Visit: Payer: Medicaid Other | Admitting: Physical Therapy

## 2022-07-18 ENCOUNTER — Other Ambulatory Visit: Payer: Self-pay

## 2022-07-18 ENCOUNTER — Encounter: Payer: Self-pay | Admitting: Physical Therapy

## 2022-07-18 ENCOUNTER — Ambulatory Visit: Payer: Medicaid Other | Attending: Obstetrics and Gynecology | Admitting: Physical Therapy

## 2022-07-18 DIAGNOSIS — R252 Cramp and spasm: Secondary | ICD-10-CM | POA: Diagnosis present

## 2022-07-18 DIAGNOSIS — R293 Abnormal posture: Secondary | ICD-10-CM | POA: Insufficient documentation

## 2022-07-18 DIAGNOSIS — M6281 Muscle weakness (generalized): Secondary | ICD-10-CM | POA: Insufficient documentation

## 2022-07-18 DIAGNOSIS — M791 Myalgia, unspecified site: Secondary | ICD-10-CM | POA: Insufficient documentation

## 2022-07-18 DIAGNOSIS — R102 Pelvic and perineal pain: Secondary | ICD-10-CM | POA: Diagnosis not present

## 2022-07-18 NOTE — Therapy (Signed)
OUTPATIENT PHYSICAL THERAPY FEMALE PELVIC EVALUATION   Patient Name: Lindsay Roberts MRN: [id number] DOB:August 25, 1985, 37 y.o., female Today's Date: 07/18/2022  END OF SESSION:  PT End of Session - 07/18/22 0816     Visit Number 1    Date for PT Re-Evaluation 10/10/22    Authorization Type Medicaid healthy blue    PT Start Time 0812    PT Stop Time 0855    PT Time Calculation (min) 43 min    Activity Tolerance Patient tolerated treatment well    Behavior During Therapy [facility] for tasks assessed/performed             Past Medical History:  Diagnosis Date   Medical history non-contributory    Past Surgical History:  Procedure Laterality Date   NO PAST SURGERIES     Patient Active Problem List   Diagnosis Date Noted   Supervision of normal pregnancy in first trimester 03/05/2015   Rubella non-immune status, antepartum 03/05/2015    PCP: [facility]   REFERRING PROVIDER: Lorriane Shire, MD   REFERRING DIAG:  R10.2 (ICD-10-CM) - Pelvic pain  M79.10 (ICD-10-CM) - Myalgia    THERAPY DIAG:  Cramp and spasm  Muscle weakness (generalized)  Abnormal posture  Rationale for Evaluation and Treatment: Rehabilitation  ONSET DATE: Nov/Dec 2023  SUBJECTIVE:                                                                                                                                                                                           SUBJECTIVE STATEMENT: Pt having pain on the Rt side mostly during cycle.  Sitting fo r a long time is worse, pressure on Rt hip and doing a kegel feels worse Fluid intake: Yes: 120 H2O    PAIN:  Are you having pain? Yes NPRS scale: 10/10 Pain location:  lower abdomen  Pain type: sharp and pressure in the perineum Pain description: intermittent   Aggravating factors: sitting a lot when on cycle Relieving factors: pressure on hip  PRECAUTIONS: None  WEIGHT BEARING RESTRICTIONS: No  FALLS:  Has patient  fallen in last 6 months? No  LIVING ENVIRONMENT: Lives with: lives with their son 71 y/o Lives in: House/apartment   OCCUPATION: full time at a desk job  PLOF: Independent  PATIENT GOALS: get rid of debilitating pain to be able to function during cycle  PERTINENT HISTORY:  PMH ovarian cyst Rt side Sexual abuse: No  BOWEL MOVEMENT: Pain with bowel movement: No Type of bowel movement:Type (Bristol Stool Scale) normal, Frequency 2/day, and Strain No   URINATION: Pain with urination: No Fully empty bladder: Yes:   Stream:  Weak during cycle but other than that strong Urgency: No Frequency: normal Leakage:  No   INTERCOURSE: Pain with intercourse:  No   PREGNANCY: Vaginal deliveries 1 Tearing Yes: 3 stitches posterior vaginal   PROLAPSE: None   OBJECTIVE:   DIAGNOSTIC FINDINGS:    PATIENT SURVEYS:    PFIQ-7 - 81 score for pelvic pain  COGNITION: Overall cognitive status: Within functional limits for tasks assessed     SENSATION: Light touch:  Proprioception:   MUSCLE LENGTH: Hamstrings: Right ~80 deg; Left ~85 deg Thomas test:   LUMBAR SPECIAL TESTS:  Straight leg raise test: Negative ASLR improves with compression suggesting core weakness; ASLR with pelvic instability doing Lt leg raise FUNCTIONAL TESTS:  SLS trendelenburg Rt>Lt  GAIT:  Comments: WFL   POSTURE: rounded shoulders, forward head, increased lumbar lordosis, and anterior pelvic tilt  PELVIC ALIGNMENT: normal  LUMBARAROM/PROM:  A/PROM A/PROM  eval  Flexion 75%  Extension   Right lateral flexion WFL   Left lateral flexion 90%  Right rotation   Left rotation    (Blank rows = not tested)  LOWER EXTREMITY ROM:  Passive ROM Right eval Left eval  Hip flexion [facility]   Hip extension    Hip abduction    Hip adduction    Hip internal rotation 50% 75%  Hip external rotation 50% 75%  Knee flexion    Knee extension    Ankle dorsiflexion    Ankle plantarflexion     Ankle inversion    Ankle eversion     (Blank rows = not tested)  LOWER EXTREMITY MMT:  MMT Right eval Left eval  Hip flexion 4+/5 4+/5  Hip extension 4/5 4/5  Hip abduction 4/5 4/5  Hip adduction 5/5 5  Hip internal rotation 4/5 5  Hip external rotation 4/5 5  Knee flexion    Knee extension    Ankle dorsiflexion    Ankle plantarflexion    Ankle inversion    Ankle eversion     PALPATION:   General  lumbar paraspinals tight, adductors tight                External Perineal Exam tension palpated, ticklish across transverse peroneus, adductor attachments tight and ischiocavernosis tight                             Internal Pelvic Floor referred pain ileococcygeus and obturator on Rt side, tight and high tone bil levators  Patient confirms identification and approves PT to assess internal pelvic floor and treatment Yes  PELVIC MMT:   MMT eval  Vaginal NA  Internal Anal Sphincter   External Anal Sphincter   Puborectalis   Diastasis Recti   (Blank rows = not tested)        TONE: high  PROLAPSE: no  TODAY'S TREATMENT:  DATE: 07/18/22  EVAL and initial HEP provided and reviewed Check all possible CPT codes: 16109 - Self Care    Check all conditions that are expected to impact treatment: {Conditions expected to impact treatment:Musculoskeletal disorders   If treatment provided at initial evaluation, no treatment charged due to lack of authorization.        PATIENT EDUCATION:  Education details: Access Code: [email or web address] Person educated: Patient Education method: Explanation, Verbal cues, and Handouts Education comprehension: verbalized understanding and needs further education  HOME EXERCISE PROGRAM: Access Code: [email or web address] URL: [email or web address] Date: 07/18/2022 Prepared by: Dwana Curd  Exercises - Child's  Pose with Sidebending  - 1 x daily - 7 x weekly - 1 sets - 3 reps - 30 sec hold - Cat Cow to Child's Pose  - 1 x daily - 7 x weekly - 1 sets - 5 reps - 10 sec hold - Supine Hip Internal and External Rotation  - 1 x daily - 7 x weekly - 1 sets - 10 reps - 5 sec hold - Supine Piriformis Stretch  - 1 x daily - 7 x weekly - 1 sets - 3 reps - 30 sec hold - Seated Hamstring Stretch  - 1 x daily - 7 x weekly - 1 sets - 3 reps - 30 sec hold  ASSESSMENT:  CLINICAL IMPRESSION: Patient is a 37 y.o. female who was seen today for physical therapy evaluation and treatment for pelvic pain. Pt is having debilitating pelvic pain during her cycle that is so severe she has to take time off work and is unable to be upright.  Pt has posture as noted above with tight lumbar paraspinals, tight adductors and restricted hip mobility with gluteal tension. Pt has high tone pelvic floor and trigger points in Rt ileococcygeus and obturator that refer to the place she is experiencing the pain.  Pt also demonstrate some pelvic instability due to hip weakness and core weakness suspected based on ASLR test.  Pt will benefit from skilled PT to address all these impairments so she is able to return to normal function.  PFIQ for pelvic pain is 81/100 today.  OBJECTIVE IMPAIRMENTS: decreased ROM, decreased strength, increased muscle spasms, impaired flexibility, impaired tone, postural dysfunction, and pain.   ACTIVITY LIMITATIONS: carrying, lifting, sitting, standing, and caring for others  PARTICIPATION LIMITATIONS: meal prep, cleaning, laundry, driving, community activity, and occupation  PERSONAL FACTORS: Time since onset of injury/illness/exacerbation and 1-2 comorbidities: vaginal delivery with tearing and ovarian cyst on right side  are also affecting patient's functional outcome.   REHAB POTENTIAL: Excellent  CLINICAL DECISION MAKING: Evolving/moderate complexity  EVALUATION COMPLEXITY: Moderate   GOALS: Goals  reviewed with patient? Yes  SHORT TERM GOALS: Target date: 08/15/22  Ind with initial HEP Baseline: Goal status: INITIAL  2.  Pt will understand correct sitting posture and be able to adjust at work as needed Baseline:  Goal status: INITIAL   LONG TERM GOALS: Target date: 10/10/22  Pt will be independent with advanced HEP to maintain improvements made throughout therapy  Baseline:  Goal status: INITIAL  2.  Pt will demonstrate 5/5 hip abduction for improved pelvic stability during functional activities Baseline:  Goal status: INITIAL  3.  Pt will report 5/10 pain at the most during menstrual cycle for ability to function at work Baseline: 10/10  Goal status: INITIAL  4.  PFIQ score reported 40 at the most Baseline: 81 Goal status: INITIAL  5.  Pt will demonstrate improved posture due to  increased lumbar muscle length and core strength Baseline:  Goal status: INITIAL   PLAN:  PT FREQUENCY: 1x/week  PT DURATION: 12 weeks  PLANNED INTERVENTIONS: Therapeutic exercises, Therapeutic activity, Neuromuscular re-education, Balance training, Gait training, Patient/Family education, Self Care, Joint mobilization, Dry Needling, Electrical stimulation, Cryotherapy, Moist heat, Taping, Biofeedback, Manual therapy, and Re-evaluation  PLAN FOR NEXT SESSION: stretch and relax lumbar and gluteals, discuss sitting posture for support during cycle, discuss dry needling lumbar and gluteals   Brayton Caves Antonisha Waskey, PT 07/18/2022, 9:17 AM

## 2022-07-25 ENCOUNTER — Ambulatory Visit: Payer: Medicaid Other | Admitting: Physical Therapy

## 2022-08-05 ENCOUNTER — Ambulatory Visit: Payer: Medicaid Other | Admitting: Physical Therapy

## 2022-08-15 ENCOUNTER — Ambulatory Visit: Payer: Medicaid Other | Admitting: Physical Therapy

## 2022-08-15 NOTE — Therapy (Deleted)
OUTPATIENT PHYSICAL THERAPY FEMALE PELVIC EVALUATION   Patient Name: Lindsay Roberts MRN: [id number] DOB:[birth year], 37 y.o., female Today's Date: 08/15/2022  END OF SESSION:    Past Medical History:  Diagnosis Date   Medical history non-contributory    Past Surgical History:  Procedure Laterality Date   NO PAST SURGERIES     Patient Active Problem List   Diagnosis Date Noted   Supervision of normal pregnancy in first trimester 03/05/2015   Rubella non-immune status, antepartum 03/05/2015    PCP: [facility]   REFERRING PROVIDER: Lorriane Shire, MD   REFERRING DIAG:  R10.2 (ICD-10-CM) - Pelvic pain  M79.10 (ICD-10-CM) - Myalgia    THERAPY DIAG:  No diagnosis found.  Rationale for Evaluation and Treatment: Rehabilitation  ONSET DATE: Nov/Dec 2023  SUBJECTIVE:                                                                                                                                                                                           SUBJECTIVE STATEMENT: Pt having pain on the Rt side mostly during cycle.  Sitting fo r a long time is worse, pressure on Rt hip and doing a kegel feels worse Fluid intake: Yes: 120 H2O    PAIN:  Are you having pain? Yes NPRS scale: 10/10 Pain location:  lower abdomen  Pain type: sharp and pressure in the perineum Pain description: intermittent   Aggravating factors: sitting a lot when on cycle Relieving factors: pressure on hip  PRECAUTIONS: None  WEIGHT BEARING RESTRICTIONS: No  FALLS:  Has patient fallen in last 6 months? No  LIVING ENVIRONMENT: Lives with: lives with their son 74 y/o Lives in: House/apartment   OCCUPATION: full time at a desk job  PLOF: Independent  PATIENT GOALS: get rid of debilitating pain to be able to function during cycle  PERTINENT HISTORY:  PMH ovarian cyst Rt side Sexual abuse: No  BOWEL MOVEMENT: Pain with bowel movement: No Type of bowel  movement:Type (Bristol Stool Scale) normal, Frequency 2/day, and Strain No   URINATION: Pain with urination: No Fully empty bladder: Yes:   Stream: Weak during cycle but other than that strong Urgency: No Frequency: normal Leakage:  No   INTERCOURSE: Pain with intercourse:  No   PREGNANCY: Vaginal deliveries 1 Tearing Yes: 3 stitches posterior vaginal   PROLAPSE: None   OBJECTIVE:   DIAGNOSTIC FINDINGS:    PATIENT SURVEYS:    PFIQ-7 - 81 score for pelvic pain  COGNITION: Overall cognitive status: Within functional limits for tasks assessed     SENSATION: Light touch:  Proprioception:   MUSCLE LENGTH: Hamstrings: Right ~80 deg;  Left ~85 deg Thomas test:   LUMBAR SPECIAL TESTS:  Straight leg raise test: Negative ASLR improves with compression suggesting core weakness; ASLR with pelvic instability doing Lt leg raise FUNCTIONAL TESTS:  SLS trendelenburg Rt>Lt  GAIT:  Comments: WFL   POSTURE: rounded shoulders, forward head, increased lumbar lordosis, and anterior pelvic tilt  PELVIC ALIGNMENT: normal  LUMBARAROM/PROM:  A/PROM A/PROM  eval  Flexion 75%  Extension   Right lateral flexion WFL   Left lateral flexion 90%  Right rotation   Left rotation    (Blank rows = not tested)  LOWER EXTREMITY ROM:  Passive ROM Right eval Left eval  Hip flexion [facility]   Hip extension    Hip abduction    Hip adduction    Hip internal rotation 50% 75%  Hip external rotation 50% 75%  Knee flexion    Knee extension    Ankle dorsiflexion    Ankle plantarflexion    Ankle inversion    Ankle eversion     (Blank rows = not tested)  LOWER EXTREMITY MMT:  MMT Right eval Left eval  Hip flexion 4+/5 4+/5  Hip extension 4/5 4/5  Hip abduction 4/5 4/5  Hip adduction 5/5 5  Hip internal rotation 4/5 5  Hip external rotation 4/5 5  Knee flexion    Knee extension    Ankle dorsiflexion    Ankle plantarflexion    Ankle inversion    Ankle eversion      PALPATION:   General  lumbar paraspinals tight, adductors tight                External Perineal Exam tension palpated, ticklish across transverse peroneus, adductor attachments tight and ischiocavernosis tight                             Internal Pelvic Floor referred pain ileococcygeus and obturator on Rt side, tight and high tone bil levators  Patient confirms identification and approves PT to assess internal pelvic floor and treatment Yes  PELVIC MMT:   MMT eval  Vaginal NA  Internal Anal Sphincter   External Anal Sphincter   Puborectalis   Diastasis Recti   (Blank rows = not tested)        TONE: high  PROLAPSE: no  TODAY'S TREATMENT:                                                                                                                              DATE: 07/18/22  EVAL and initial HEP provided and reviewed Check all possible CPT codes: 16109 - Self Care    Check all conditions that are expected to impact treatment: {Conditions expected to impact treatment:Musculoskeletal disorders   If treatment provided at initial evaluation, no treatment charged due to lack of authorization.        PATIENT EDUCATION:  Education details: Access Code: [email or web address] Person educated: Patient  Education method: Explanation, Verbal cues, and Handouts Education comprehension: verbalized understanding and needs further education  HOME EXERCISE PROGRAM: Access Code: [email or web address] URL: [email or web address] Date: 07/18/2022 Prepared by: Dwana Curd  Exercises - Child's Pose with Sidebending  - 1 x daily - 7 x weekly - 1 sets - 3 reps - 30 sec hold - Cat Cow to Child's Pose  - 1 x daily - 7 x weekly - 1 sets - 5 reps - 10 sec hold - Supine Hip Internal and External Rotation  - 1 x daily - 7 x weekly - 1 sets - 10 reps - 5 sec hold - Supine Piriformis Stretch  - 1 x daily - 7 x weekly - 1 sets - 3 reps - 30 sec hold - Seated Hamstring Stretch  - 1 x daily - 7 x  weekly - 1 sets - 3 reps - 30 sec hold  ASSESSMENT:  CLINICAL IMPRESSION: Patient is a 37 y.o. female who was seen today for physical therapy evaluation and treatment for pelvic pain. Pt is having debilitating pelvic pain during her cycle that is so severe she has to take time off work and is unable to be upright.  Pt has posture as noted above with tight lumbar paraspinals, tight adductors and restricted hip mobility with gluteal tension. Pt has high tone pelvic floor and trigger points in Rt ileococcygeus and obturator that refer to the place she is experiencing the pain.  Pt also demonstrate some pelvic instability due to hip weakness and core weakness suspected based on ASLR test.  Pt will benefit from skilled PT to address all these impairments so she is able to return to normal function.  PFIQ for pelvic pain is 81/100 today.  OBJECTIVE IMPAIRMENTS: decreased ROM, decreased strength, increased muscle spasms, impaired flexibility, impaired tone, postural dysfunction, and pain.   ACTIVITY LIMITATIONS: carrying, lifting, sitting, standing, and caring for others  PARTICIPATION LIMITATIONS: meal prep, cleaning, laundry, driving, community activity, and occupation  PERSONAL FACTORS: Time since onset of injury/illness/exacerbation and 1-2 comorbidities: vaginal delivery with tearing and ovarian cyst on right side  are also affecting patient's functional outcome.   REHAB POTENTIAL: Excellent  CLINICAL DECISION MAKING: Evolving/moderate complexity  EVALUATION COMPLEXITY: Moderate   GOALS: Goals reviewed with patient? Yes  SHORT TERM GOALS: Target date: 08/15/22  Ind with initial HEP Baseline: Goal status: INITIAL  2.  Pt will understand correct sitting posture and be able to adjust at work as needed Baseline:  Goal status: INITIAL   LONG TERM GOALS: Target date: 10/10/22  Pt will be independent with advanced HEP to maintain improvements made throughout therapy  Baseline:  Goal  status: INITIAL  2.  Pt will demonstrate 5/5 hip abduction for improved pelvic stability during functional activities Baseline:  Goal status: INITIAL  3.  Pt will report 5/10 pain at the most during menstrual cycle for ability to function at work Baseline: 10/10  Goal status: INITIAL  4.  PFIQ score reported 40 at the most Baseline: 81 Goal status: INITIAL  5.  Pt will demonstrate improved posture due to increased lumbar muscle length and core strength Baseline:  Goal status: INITIAL   PLAN:  PT FREQUENCY: 1x/week  PT DURATION: 12 weeks  PLANNED INTERVENTIONS: Therapeutic exercises, Therapeutic activity, Neuromuscular re-education, Balance training, Gait training, Patient/Family education, Self Care, Joint mobilization, Dry Needling, Electrical stimulation, Cryotherapy, Moist heat, Taping, Biofeedback, Manual therapy, and Re-evaluation  PLAN FOR NEXT SESSION: stretch and relax lumbar and gluteals, discuss sitting posture for  support during cycle, discuss dry needling lumbar and gluteals   Brayton Caves Arnesha Schiraldi, PT 08/15/2022, 7:40 AM

## 2022-08-17 NOTE — Therapy (Unsigned)
OUTPATIENT PHYSICAL THERAPY FEMALE PELVIC Treatment   Patient Name: Lindsay Roberts MRN: [id number] DOB:02-10-1986, 37 y.o., female Today's Date: 08/18/2022  END OF SESSION:  PT End of Session - 08/18/22 1604     Visit Number 2    Date for PT Re-Evaluation 10/10/22    Authorization Type Medicaid [employer]    PT Start Time 1620    PT Stop Time 1700    PT Time Calculation (min) 40 min    Activity Tolerance Patient tolerated treatment well    Behavior During Therapy WFL for tasks assessed/performed              Past Medical History:  Diagnosis Date   Medical history non-contributory    Past Surgical History:  Procedure Laterality Date   NO PAST SURGERIES     Patient Active Problem List   Diagnosis Date Noted   Supervision of normal pregnancy in first trimester 03/05/2015   Rubella non-immune status, antepartum 03/05/2015    PCP: [facility]   REFERRING PROVIDER: Lorriane Shire, MD   REFERRING DIAG:  R10.2 (ICD-10-CM) - Pelvic pain  M79.10 (ICD-10-CM) - Myalgia    THERAPY DIAG:  Cramp and spasm  Muscle weakness (generalized)  Abnormal posture  Rationale for Evaluation and Treatment: Rehabilitation  ONSET DATE: Nov/Dec 2023  SUBJECTIVE:                                                                                                                                                                                           SUBJECTIVE STATEMENT: Pt had more pain this past cycle last week.  Currently no pain right now Fluid intake: Yes: 120 H2O    PAIN:  Are you having pain? No   PRECAUTIONS: None  WEIGHT BEARING RESTRICTIONS: No  FALLS:  Has patient fallen in last 6 months? No  LIVING ENVIRONMENT: Lives with: lives with their son 5 y/o Lives in: House/apartment   OCCUPATION: full time at a desk job  PLOF: Independent  PATIENT GOALS: get rid of debilitating pain to be able to function during cycle  PERTINENT HISTORY:   PMH ovarian cyst Rt side Sexual abuse: No  BOWEL MOVEMENT: Pain with bowel movement: No Type of bowel movement:Type (Bristol Stool Scale) normal, Frequency 2/day, and Strain No   URINATION: Pain with urination: No Fully empty bladder: Yes:   Stream: Weak during cycle but other than that strong Urgency: No Frequency: normal Leakage:  No   INTERCOURSE: Pain with intercourse:  No   PREGNANCY: Vaginal deliveries 1 Tearing Yes: 3 stitches posterior vaginal   PROLAPSE: None   OBJECTIVE:   DIAGNOSTIC FINDINGS:  PATIENT SURVEYS:    PFIQ-7 - 81 score for pelvic pain  COGNITION: Overall cognitive status: Within functional limits for tasks assessed     SENSATION: Light touch:  Proprioception:   MUSCLE LENGTH: Hamstrings: Right ~80 deg; Left ~85 deg Thomas test:   LUMBAR SPECIAL TESTS:  Straight leg raise test: Negative ASLR improves with compression suggesting core weakness; ASLR with pelvic instability doing Lt leg raise FUNCTIONAL TESTS:  SLS trendelenburg Rt>Lt  GAIT:  Comments: WFL   POSTURE: rounded shoulders, forward head, increased lumbar lordosis, and anterior pelvic tilt  PELVIC ALIGNMENT: normal  LUMBARAROM/PROM:  A/PROM A/PROM  eval  Flexion 75%  Extension   Right lateral flexion WFL   Left lateral flexion 90%  Right rotation   Left rotation    (Blank rows = not tested)  LOWER EXTREMITY ROM:  Passive ROM Right eval Left eval  Hip flexion [facility]   Hip extension    Hip abduction    Hip adduction    Hip internal rotation 50% 75%  Hip external rotation 50% 75%  Knee flexion    Knee extension    Ankle dorsiflexion    Ankle plantarflexion    Ankle inversion    Ankle eversion     (Blank rows = not tested)  LOWER EXTREMITY MMT:  MMT Right eval Left eval  Hip flexion 4+/5 4+/5  Hip extension 4/5 4/5  Hip abduction 4/5 4/5  Hip adduction 5/5 5  Hip internal rotation 4/5 5  Hip external rotation 4/5 5  Knee flexion     Knee extension    Ankle dorsiflexion    Ankle plantarflexion    Ankle inversion    Ankle eversion     PALPATION:   General  lumbar paraspinals tight, adductors tight                External Perineal Exam tension palpated, ticklish across transverse peroneus, adductor attachments tight and ischiocavernosis tight                             Internal Pelvic Floor referred pain ileococcygeus and obturator on Rt side, tight and high tone bil levators  Patient confirms identification and approves PT to assess internal pelvic floor and treatment Yes  PELVIC MMT:   MMT eval  Vaginal NA  Internal Anal Sphincter   External Anal Sphincter   Puborectalis   Diastasis Recti   (Blank rows = not tested)        TONE: high  PROLAPSE: no  TODAY'S TREATMENT:                                                                                                                              DATE: 08/18/22  Manual: lumbar paraspinals and gluteals bil Trigger Point Dry-Needling  Treatment instructions: Expect mild to moderate muscle soreness. S/S of pneumothorax if dry needled over a lung field,  and to seek immediate medical attention should they occur. Patient verbalized understanding of these instructions and education.  Patient Consent Given: Yes Education handout provided: Yes on medbridge Muscles treated: lumbar multifidi, bil glute med, min, piriformis Electrical stimulation performed: No Parameters: N/A Treatment response/outcome: increased soft tissue length  Exercises: Qped cirlces, child pose, cat cow Hip rotation Piriformis stretch On wedge pillow pelvic floor relaxation with breathing      Theract: Posture with sitting and discussed seat height   PATIENT EDUCATION:  Education details: Access Code: [email or web address] Person educated: Patient Education method: Explanation, Verbal cues, and Handouts Education comprehension: verbalized understanding and needs further education  HOME  EXERCISE PROGRAM: [email or web address] - added qped circle and piriformis stretch and dry needling   ASSESSMENT:  CLINICAL IMPRESSION: Today's session focused on improved soft tissue length and continued down training of pelvic floor.  Pt did well with dry needling and experienced more relaxation and increased muscle length was palpated.  Pt will benefit from skilled PT to continue to address flexibility and postural strength for pain management  OBJECTIVE IMPAIRMENTS: decreased ROM, decreased strength, increased muscle spasms, impaired flexibility, impaired tone, postural dysfunction, and pain.   ACTIVITY LIMITATIONS: carrying, lifting, sitting, standing, and caring for others  PARTICIPATION LIMITATIONS: meal prep, cleaning, laundry, driving, community activity, and occupation  PERSONAL FACTORS: Time since onset of injury/illness/exacerbation and 1-2 comorbidities: vaginal delivery with tearing and ovarian cyst on right side  are also affecting patient's functional outcome.   REHAB POTENTIAL: Excellent  CLINICAL DECISION MAKING: Evolving/moderate complexity  EVALUATION COMPLEXITY: Moderate   GOALS: Goals reviewed with patient? Yes  SHORT TERM GOALS: Target date: 08/15/22  Ind with initial HEP Baseline: Goal status: MET  2.  Pt will understand correct sitting posture and be able to adjust at work as needed Baseline:  Goal status: IN PROGRESS   LONG TERM GOALS: Target date: 10/10/22  Pt will be independent with advanced HEP to maintain improvements made throughout therapy  Baseline:  Goal status: INITIAL  2.  Pt will demonstrate 5/5 hip abduction for improved pelvic stability during functional activities Baseline:  Goal status: INITIAL  3.  Pt will report 5/10 pain at the most during menstrual cycle for ability to function at work Baseline: 10/10  Goal status: INITIAL  4.  PFIQ score reported 40 at the most Baseline: 81 Goal status: INITIAL  5.  Pt will demonstrate improved  posture due to increased lumbar muscle length and core strength Baseline:  Goal status: INITIAL   PLAN:  PT FREQUENCY: 1x/week  PT DURATION: 12 weeks  PLANNED INTERVENTIONS: Therapeutic exercises, Therapeutic activity, Neuromuscular re-education, Balance training, Gait training, Patient/Family education, Self Care, Joint mobilization, Dry Needling, Electrical stimulation, Cryotherapy, Moist heat, Taping, Biofeedback, Manual therapy, and Re-evaluation  PLAN FOR NEXT SESSION: stretch and relax lumbar and gluteals, and dry needling #2; f/u on sitting posture, transversus abdominus activation for postural strength  Brayton Caves Trea Carnegie, PT 08/18/2022, 4:20 PM

## 2022-08-18 ENCOUNTER — Ambulatory Visit: Payer: Medicaid Other | Attending: Obstetrics and Gynecology | Admitting: Physical Therapy

## 2022-08-18 ENCOUNTER — Encounter: Payer: Self-pay | Admitting: Physical Therapy

## 2022-08-18 DIAGNOSIS — R252 Cramp and spasm: Secondary | ICD-10-CM | POA: Insufficient documentation

## 2022-08-18 DIAGNOSIS — M6281 Muscle weakness (generalized): Secondary | ICD-10-CM | POA: Diagnosis present

## 2022-08-18 DIAGNOSIS — R293 Abnormal posture: Secondary | ICD-10-CM | POA: Insufficient documentation

## 2022-08-27 NOTE — Therapy (Deleted)
OUTPATIENT PHYSICAL THERAPY FEMALE PELVIC Treatment   Patient Name: Sparkles Mohring MRN: [id number] DOB:December 14, 1985, 37 y.o., female Today's Date: 08/27/2022  END OF SESSION:     Past Medical History:  Diagnosis Date   Medical history non-contributory    Past Surgical History:  Procedure Laterality Date   NO PAST SURGERIES     Patient Active Problem List   Diagnosis Date Noted   Supervision of normal pregnancy in first trimester 03/05/2015   Rubella non-immune status, antepartum 03/05/2015    PCP: [facility]   REFERRING PROVIDER: Lorriane Shire, MD   REFERRING DIAG:  R10.2 (ICD-10-CM) - Pelvic pain  M79.10 (ICD-10-CM) - Myalgia    THERAPY DIAG:  No diagnosis found.  Rationale for Evaluation and Treatment: Rehabilitation  ONSET DATE: Nov/Dec 2023  SUBJECTIVE:                                                                                                                                                                                           SUBJECTIVE STATEMENT: Pt had more pain this past cycle last week.  Currently no pain right now Fluid intake: Yes: 120 H2O    PAIN:  Are you having pain? No   PRECAUTIONS: None  WEIGHT BEARING RESTRICTIONS: No  FALLS:  Has patient fallen in last 6 months? No  LIVING ENVIRONMENT: Lives with: lives with their son 36 y/o Lives in: House/apartment   OCCUPATION: full time at a desk job  PLOF: Independent  PATIENT GOALS: get rid of debilitating pain to be able to function during cycle  PERTINENT HISTORY:  PMH ovarian cyst Rt side Sexual abuse: No  BOWEL MOVEMENT: Pain with bowel movement: No Type of bowel movement:Type (Bristol Stool Scale) normal, Frequency 2/day, and Strain No   URINATION: Pain with urination: No Fully empty bladder: Yes:   Stream: Weak during cycle but other than that strong Urgency: No Frequency: normal Leakage:  No   INTERCOURSE: Pain with intercourse:   No   PREGNANCY: Vaginal deliveries 1 Tearing Yes: 3 stitches posterior vaginal   PROLAPSE: None   OBJECTIVE:   DIAGNOSTIC FINDINGS:    PATIENT SURVEYS:    PFIQ-7 - 81 score for pelvic pain  COGNITION: Overall cognitive status: Within functional limits for tasks assessed     SENSATION: Light touch:  Proprioception:   MUSCLE LENGTH: Hamstrings: Right ~80 deg; Left ~85 deg Thomas test:   LUMBAR SPECIAL TESTS:  Straight leg raise test: Negative ASLR improves with compression suggesting core weakness; ASLR with pelvic instability doing Lt leg raise FUNCTIONAL TESTS:  SLS trendelenburg Rt>Lt  GAIT:  Comments: WFL   POSTURE: rounded shoulders,  forward head, increased lumbar lordosis, and anterior pelvic tilt  PELVIC ALIGNMENT: normal  LUMBARAROM/PROM:  A/PROM A/PROM  eval  Flexion 75%  Extension   Right lateral flexion WFL   Left lateral flexion 90%  Right rotation   Left rotation    (Blank rows = not tested)  LOWER EXTREMITY ROM:  Passive ROM Right eval Left eval  Hip flexion [facility]   Hip extension    Hip abduction    Hip adduction    Hip internal rotation 50% 75%  Hip external rotation 50% 75%  Knee flexion    Knee extension    Ankle dorsiflexion    Ankle plantarflexion    Ankle inversion    Ankle eversion     (Blank rows = not tested)  LOWER EXTREMITY MMT:  MMT Right eval Left eval  Hip flexion 4+/5 4+/5  Hip extension 4/5 4/5  Hip abduction 4/5 4/5  Hip adduction 5/5 5  Hip internal rotation 4/5 5  Hip external rotation 4/5 5  Knee flexion    Knee extension    Ankle dorsiflexion    Ankle plantarflexion    Ankle inversion    Ankle eversion     PALPATION:   General  lumbar paraspinals tight, adductors tight                External Perineal Exam tension palpated, ticklish across transverse peroneus, adductor attachments tight and ischiocavernosis tight                             Internal Pelvic Floor referred pain  ileococcygeus and obturator on Rt side, tight and high tone bil levators  Patient confirms identification and approves PT to assess internal pelvic floor and treatment Yes  PELVIC MMT:   MMT eval  Vaginal NA  Internal Anal Sphincter   External Anal Sphincter   Puborectalis   Diastasis Recti   (Blank rows = not tested)        TONE: high  PROLAPSE: no  TODAY'S TREATMENT:                                                                                                                              DATE: 08/18/22  Manual: lumbar paraspinals and gluteals bil Trigger Point Dry-Needling  Treatment instructions: Expect mild to moderate muscle soreness. S/S of pneumothorax if dry needled over a lung field, and to seek immediate medical attention should they occur. Patient verbalized understanding of these instructions and education.  Patient Consent Given: Yes Education handout provided: Yes on medbridge Muscles treated: lumbar multifidi, bil glute med, min, piriformis Electrical stimulation performed: No Parameters: N/A Treatment response/outcome: increased soft tissue length  Exercises: Qped cirlces, child pose, cat cow Hip rotation Piriformis stretch On wedge pillow pelvic floor relaxation with breathing      Theract: Posture with sitting and discussed seat height   PATIENT EDUCATION:  Education details: Access Code: [email or web address] Person educated: Patient Education method: Explanation, Verbal cues, and Handouts Education comprehension: verbalized understanding and needs further education  HOME EXERCISE PROGRAM: [email or web address] - added qped circle and piriformis stretch and dry needling   ASSESSMENT:  CLINICAL IMPRESSION: Today's session focused on improved soft tissue length and continued down training of pelvic floor.  Pt did well with dry needling and experienced more relaxation and increased muscle length was palpated.  Pt will benefit from skilled PT to continue to address  flexibility and postural strength for pain management  OBJECTIVE IMPAIRMENTS: decreased ROM, decreased strength, increased muscle spasms, impaired flexibility, impaired tone, postural dysfunction, and pain.   ACTIVITY LIMITATIONS: carrying, lifting, sitting, standing, and caring for others  PARTICIPATION LIMITATIONS: meal prep, cleaning, laundry, driving, community activity, and occupation  PERSONAL FACTORS: Time since onset of injury/illness/exacerbation and 1-2 comorbidities: vaginal delivery with tearing and ovarian cyst on right side  are also affecting patient's functional outcome.   REHAB POTENTIAL: Excellent  CLINICAL DECISION MAKING: Evolving/moderate complexity  EVALUATION COMPLEXITY: Moderate   GOALS: Goals reviewed with patient? Yes  SHORT TERM GOALS: Target date: 08/15/22  Ind with initial HEP Baseline: Goal status: MET  2.  Pt will understand correct sitting posture and be able to adjust at work as needed Baseline:  Goal status: IN PROGRESS   LONG TERM GOALS: Target date: 10/10/22  Pt will be independent with advanced HEP to maintain improvements made throughout therapy  Baseline:  Goal status: INITIAL  2.  Pt will demonstrate 5/5 hip abduction for improved pelvic stability during functional activities Baseline:  Goal status: INITIAL  3.  Pt will report 5/10 pain at the most during menstrual cycle for ability to function at work Baseline: 10/10  Goal status: INITIAL  4.  PFIQ score reported 40 at the most Baseline: 81 Goal status: INITIAL  5.  Pt will demonstrate improved posture due to increased lumbar muscle length and core strength Baseline:  Goal status: INITIAL   PLAN:  PT FREQUENCY: 1x/week  PT DURATION: 12 weeks  PLANNED INTERVENTIONS: Therapeutic exercises, Therapeutic activity, Neuromuscular re-education, Balance training, Gait training, Patient/Family education, Self Care, Joint mobilization, Dry Needling, Electrical stimulation,  Cryotherapy, Moist heat, Taping, Biofeedback, Manual therapy, and Re-evaluation  PLAN FOR NEXT SESSION: stretch and relax lumbar and gluteals, and dry needling #2; f/u on sitting posture, transversus abdominus activation for postural strength  Brayton Caves Namya Voges, PT 08/27/2022, 6:10 PM

## 2022-08-29 ENCOUNTER — Ambulatory Visit: Payer: Medicaid Other | Admitting: Physical Therapy

## 2022-09-05 ENCOUNTER — Ambulatory Visit: Payer: Medicaid Other | Admitting: Physical Therapy

## 2022-09-12 ENCOUNTER — Ambulatory Visit: Payer: Medicaid Other | Admitting: Physical Therapy

## 2022-09-12 NOTE — Therapy (Deleted)
OUTPATIENT PHYSICAL THERAPY FEMALE PELVIC Treatment   Patient Name: Lindsay Roberts MRN: [id number] DOB:08-07-1985, 37 y.o., female Today's Date: 09/12/2022  END OF SESSION:     Past Medical History:  Diagnosis Date   Medical history non-contributory    Past Surgical History:  Procedure Laterality Date   NO PAST SURGERIES     Patient Active Problem List   Diagnosis Date Noted   Supervision of normal pregnancy in first trimester 03/05/2015   Rubella non-immune status, antepartum 03/05/2015    PCP: [facility]   REFERRING PROVIDER: Lorriane Shire, MD   REFERRING DIAG:  R10.2 (ICD-10-CM) - Pelvic pain  M79.10 (ICD-10-CM) - Myalgia    THERAPY DIAG:  No diagnosis found.  Rationale for Evaluation and Treatment: Rehabilitation  ONSET DATE: Nov/Dec 2023  SUBJECTIVE:                                                                                                                                                                                           SUBJECTIVE STATEMENT: Pt had more pain this past cycle last week.  Currently no pain right now Fluid intake: Yes: 120 H2O    PAIN:  Are you having pain? No   PRECAUTIONS: None  WEIGHT BEARING RESTRICTIONS: No  FALLS:  Has patient fallen in last 6 months? No  LIVING ENVIRONMENT: Lives with: lives with their son 45 y/o Lives in: House/apartment   OCCUPATION: full time at a desk job  PLOF: Independent  PATIENT GOALS: get rid of debilitating pain to be able to function during cycle  PERTINENT HISTORY:  PMH ovarian cyst Rt side Sexual abuse: No  BOWEL MOVEMENT: Pain with bowel movement: No Type of bowel movement:Type (Bristol Stool Scale) normal, Frequency 2/day, and Strain No   URINATION: Pain with urination: No Fully empty bladder: Yes:   Stream: Weak during cycle but other than that strong Urgency: No Frequency: normal Leakage:  No   INTERCOURSE: Pain with intercourse:   No   PREGNANCY: Vaginal deliveries 1 Tearing Yes: 3 stitches posterior vaginal   PROLAPSE: None   OBJECTIVE:   DIAGNOSTIC FINDINGS:    PATIENT SURVEYS:    PFIQ-7 - 81 score for pelvic pain  COGNITION: Overall cognitive status: Within functional limits for tasks assessed     SENSATION: Light touch:  Proprioception:   MUSCLE LENGTH: Hamstrings: Right ~80 deg; Left ~85 deg Thomas test:   LUMBAR SPECIAL TESTS:  Straight leg raise test: Negative ASLR improves with compression suggesting core weakness; ASLR with pelvic instability doing Lt leg raise FUNCTIONAL TESTS:  SLS trendelenburg Rt>Lt  GAIT:  Comments: WFL   POSTURE: rounded shoulders,  forward head, increased lumbar lordosis, and anterior pelvic tilt  PELVIC ALIGNMENT: normal  LUMBARAROM/PROM:  A/PROM A/PROM  eval  Flexion 75%  Extension   Right lateral flexion WFL   Left lateral flexion 90%  Right rotation   Left rotation    (Blank rows = not tested)  LOWER EXTREMITY ROM:  Passive ROM Right eval Left eval  Hip flexion [facility]   Hip extension    Hip abduction    Hip adduction    Hip internal rotation 50% 75%  Hip external rotation 50% 75%  Knee flexion    Knee extension    Ankle dorsiflexion    Ankle plantarflexion    Ankle inversion    Ankle eversion     (Blank rows = not tested)  LOWER EXTREMITY MMT:  MMT Right eval Left eval  Hip flexion 4+/5 4+/5  Hip extension 4/5 4/5  Hip abduction 4/5 4/5  Hip adduction 5/5 5  Hip internal rotation 4/5 5  Hip external rotation 4/5 5  Knee flexion    Knee extension    Ankle dorsiflexion    Ankle plantarflexion    Ankle inversion    Ankle eversion     PALPATION:   General  lumbar paraspinals tight, adductors tight                External Perineal Exam tension palpated, ticklish across transverse peroneus, adductor attachments tight and ischiocavernosis tight                             Internal Pelvic Floor referred pain  ileococcygeus and obturator on Rt side, tight and high tone bil levators  Patient confirms identification and approves PT to assess internal pelvic floor and treatment Yes  PELVIC MMT:   MMT eval  Vaginal NA  Internal Anal Sphincter   External Anal Sphincter   Puborectalis   Diastasis Recti   (Blank rows = not tested)        TONE: high  PROLAPSE: no  TODAY'S TREATMENT:                                                                                                                              DATE: 08/18/22  Manual: lumbar paraspinals and gluteals bil Trigger Point Dry-Needling  Treatment instructions: Expect mild to moderate muscle soreness. S/S of pneumothorax if dry needled over a lung field, and to seek immediate medical attention should they occur. Patient verbalized understanding of these instructions and education.  Patient Consent Given: Yes Education handout provided: Yes on medbridge Muscles treated: lumbar multifidi, bil glute med, min, piriformis Electrical stimulation performed: No Parameters: N/A Treatment response/outcome: increased soft tissue length  Exercises: Qped cirlces, child pose, cat cow Hip rotation Piriformis stretch On wedge pillow pelvic floor relaxation with breathing      Theract: Posture with sitting and discussed seat height   PATIENT EDUCATION:  Education details: Access Code: [email or web address] Person educated: Patient Education method: Explanation, Verbal cues, and Handouts Education comprehension: verbalized understanding and needs further education  HOME EXERCISE PROGRAM: [email or web address] - added qped circle and piriformis stretch and dry needling   ASSESSMENT:  CLINICAL IMPRESSION: Today's session focused on improved soft tissue length and continued down training of pelvic floor.  Pt did well with dry needling and experienced more relaxation and increased muscle length was palpated.  Pt will benefit from skilled PT to continue to address  flexibility and postural strength for pain management  OBJECTIVE IMPAIRMENTS: decreased ROM, decreased strength, increased muscle spasms, impaired flexibility, impaired tone, postural dysfunction, and pain.   ACTIVITY LIMITATIONS: carrying, lifting, sitting, standing, and caring for others  PARTICIPATION LIMITATIONS: meal prep, cleaning, laundry, driving, community activity, and occupation  PERSONAL FACTORS: Time since onset of injury/illness/exacerbation and 1-2 comorbidities: vaginal delivery with tearing and ovarian cyst on right side  are also affecting patient's functional outcome.   REHAB POTENTIAL: Excellent  CLINICAL DECISION MAKING: Evolving/moderate complexity  EVALUATION COMPLEXITY: Moderate   GOALS: Goals reviewed with patient? Yes  SHORT TERM GOALS: Target date: 08/15/22  Ind with initial HEP Baseline: Goal status: MET  2.  Pt will understand correct sitting posture and be able to adjust at work as needed Baseline:  Goal status: IN PROGRESS   LONG TERM GOALS: Target date: 10/10/22  Pt will be independent with advanced HEP to maintain improvements made throughout therapy  Baseline:  Goal status: INITIAL  2.  Pt will demonstrate 5/5 hip abduction for improved pelvic stability during functional activities Baseline:  Goal status: INITIAL  3.  Pt will report 5/10 pain at the most during menstrual cycle for ability to function at work Baseline: 10/10  Goal status: INITIAL  4.  PFIQ score reported 40 at the most Baseline: 81 Goal status: INITIAL  5.  Pt will demonstrate improved posture due to increased lumbar muscle length and core strength Baseline:  Goal status: INITIAL   PLAN:  PT FREQUENCY: 1x/week  PT DURATION: 12 weeks  PLANNED INTERVENTIONS: Therapeutic exercises, Therapeutic activity, Neuromuscular re-education, Balance training, Gait training, Patient/Family education, Self Care, Joint mobilization, Dry Needling, Electrical stimulation,  Cryotherapy, Moist heat, Taping, Biofeedback, Manual therapy, and Re-evaluation  PLAN FOR NEXT SESSION: stretch and relax lumbar and gluteals, and dry needling #2; f/u on sitting posture, transversus abdominus activation for postural strength  Brayton Caves , PT 09/12/2022, 7:52 AM

## 2022-09-18 NOTE — Therapy (Signed)
OUTPATIENT PHYSICAL THERAPY FEMALE PELVIC Treatment   Patient Name: Lindsay Roberts MRN: [id number] DOB:[birth year], 37 y.o., female Today's Date: 09/19/2022  END OF SESSION:  PT End of Session - 09/19/22 0848     Visit Number 3    Date for PT Re-Evaluation 12/12/22    Authorization Type Medicaid healthy blue - submitted 7/26    PT Start Time 0848    PT Stop Time 0928    PT Time Calculation (min) 40 min    Activity Tolerance Patient tolerated treatment well    Behavior During Therapy [facility] for tasks assessed/performed               Past Medical History:  Diagnosis Date   Medical history non-contributory    Past Surgical History:  Procedure Laterality Date   NO PAST SURGERIES     Patient Active Problem List   Diagnosis Date Noted   Supervision of normal pregnancy in first trimester 03/05/2015   Rubella non-immune status, antepartum 03/05/2015    PCP: [facility]   REFERRING PROVIDER: Lorriane Shire, MD   REFERRING DIAG:  R10.2 (ICD-10-CM) - Pelvic pain  M79.10 (ICD-10-CM) - Myalgia    THERAPY DIAG:  Cramp and spasm  Muscle weakness (generalized)  Abnormal posture  Rationale for Evaluation and Treatment: Rehabilitation  ONSET DATE: Nov/Dec 2023  SUBJECTIVE:                                                                                                                                                                                           SUBJECTIVE STATEMENT: Pt feels the sitting posture has helped a lot and feels like the exercises are helping.  I still cannot function the first 2 days of cycle and the medicine causes me to just sleep. Fluid intake: Yes: 120 H2O    PAIN:  Are you having pain? No   PRECAUTIONS: None  WEIGHT BEARING RESTRICTIONS: No  FALLS:  Has patient fallen in last 6 months? No  LIVING ENVIRONMENT: Lives with: lives with their son 10 y/o Lives in: House/apartment   OCCUPATION: full time at a desk  job  PLOF: Independent  PATIENT GOALS: get rid of debilitating pain to be able to function during cycle  PERTINENT HISTORY:  PMH ovarian cyst Rt side Sexual abuse: No  BOWEL MOVEMENT: Pain with bowel movement: No Type of bowel movement:Type (Bristol Stool Scale) normal, Frequency 2/day, and Strain No   URINATION: Pain with urination: No Fully empty bladder: Yes:   Stream: Weak during cycle but other than that strong Urgency: No Frequency: normal Leakage:  No   INTERCOURSE: Pain with intercourse:  No  PREGNANCY: Vaginal deliveries 1 Tearing Yes: 3 stitches posterior vaginal   PROLAPSE: None   OBJECTIVE:   DIAGNOSTIC FINDINGS:    PATIENT SURVEYS:    PFIQ-7 - 81 score for pelvic pain  COGNITION: Overall cognitive status: Within functional limits for tasks assessed     SENSATION: Light touch:  Proprioception:   MUSCLE LENGTH: Hamstrings: Right ~80 deg; Left ~85 deg Thomas test:   LUMBAR SPECIAL TESTS:  Straight leg raise test: Negative ASLR improves with compression suggesting core weakness; ASLR with pelvic instability doing Lt leg raise FUNCTIONAL TESTS:  SLS trendelenburg Rt>Lt  GAIT:  Comments: WFL   POSTURE: rounded shoulders, forward head, increased lumbar lordosis, and anterior pelvic tilt  PELVIC ALIGNMENT: normal  LUMBARAROM/PROM:  A/PROM A/PROM  eval  Flexion 75%  Extension   Right lateral flexion WFL   Left lateral flexion 90%  Right rotation   Left rotation    (Blank rows = not tested)  LOWER EXTREMITY ROM:  Passive ROM Right eval Left eval  Hip flexion [facility]   Hip extension    Hip abduction    Hip adduction    Hip internal rotation 50% 75%  Hip external rotation 50% 75%  Knee flexion    Knee extension    Ankle dorsiflexion    Ankle plantarflexion    Ankle inversion    Ankle eversion     (Blank rows = not tested)  LOWER EXTREMITY MMT:  MMT Right eval Left eval  Hip flexion 4+/5 4+/5  Hip extension  4/5 4/5  Hip abduction 4/5 4/5  Hip adduction 5/5 5  Hip internal rotation 4/5 5  Hip external rotation 4/5 5  Knee flexion    Knee extension    Ankle dorsiflexion    Ankle plantarflexion    Ankle inversion    Ankle eversion     PALPATION:   General  lumbar paraspinals tight, adductors tight                External Perineal Exam tension palpated, ticklish across transverse peroneus, adductor attachments tight and ischiocavernosis tight                             Internal Pelvic Floor referred pain ileococcygeus and obturator on Rt side, tight and high tone bil levators  Patient confirms identification and approves PT to assess internal pelvic floor and treatment Yes  PELVIC MMT:   MMT eval  Vaginal NA  Internal Anal Sphincter   External Anal Sphincter   Puborectalis   Diastasis Recti   (Blank rows = not tested)        TONE: high  PROLAPSE: no  TODAY'S TREATMENT:                                                                                                                              DATE: 09/19/22  Manual: lumbar paraspinals and  gluteals bil Trigger Point Dry-Needling  Treatment instructions: Expect mild to moderate muscle soreness. S/S of pneumothorax if dry needled over a lung field, and to seek immediate medical attention should they occur. Patient verbalized understanding of these instructions and education.  Patient Consent Given: Yes Education handout provided: Yes on medbridge Muscles treated: lumbar multifidi, bil glute med, min, piriformis Electrical stimulation performed: No Parameters: N/A Treatment response/outcome: increased soft tissue length  Exercises: Supine alt LE march, alternating UE pressing, UE arches    08/18/22  Manual: lumbar paraspinals and gluteals bil Trigger Point Dry-Needling  Treatment instructions: Expect mild to moderate muscle soreness. S/S of pneumothorax if dry needled over a lung field, and to seek immediate medical  attention should they occur. Patient verbalized understanding of these instructions and education.  Patient Consent Given: Yes Education handout provided: Yes on medbridge Muscles treated: lumbar multifidi, bil glute med, min, piriformis Electrical stimulation performed: No Parameters: N/A Treatment response/outcome: increased soft tissue length  Exercises: Qped cirlces, child pose, cat cow Hip rotation Piriformis stretch On wedge pillow pelvic floor relaxation with breathing      Theract: Posture with sitting and discussed seat height   PATIENT EDUCATION:  Education details: Access Code: [email or web address] Person educated: Patient Education method: Explanation, Verbal cues, and Handouts Education comprehension: verbalized understanding and needs further education  HOME EXERCISE PROGRAM: [id number] - added qped circle and piriformis stretch and dry needling   ASSESSMENT:  CLINICAL IMPRESSION: Goals were reviewed as mentioned below.  Pt has missed several appointments due to illness and was not able to get consistency, but pt in a place that she is able to come more consistently now.  Pt is definitely making progress and was able to work more on core strength today.  Added progression to HEP.  Sill getting good release from dry needling and manual techniques to work on tension in gluteals, piriformis and lumbar paraspinals.  Pt will benefit from skilled PT to continue to address flexibility and postural strength for pain management  OBJECTIVE IMPAIRMENTS: decreased ROM, decreased strength, increased muscle spasms, impaired flexibility, impaired tone, postural dysfunction, and pain.   ACTIVITY LIMITATIONS: carrying, lifting, sitting, standing, and caring for others  PARTICIPATION LIMITATIONS: meal prep, cleaning, laundry, driving, community activity, and occupation  PERSONAL FACTORS: Time since onset of injury/illness/exacerbation and 1-2 comorbidities: vaginal delivery with tearing and  ovarian cyst on right side  are also affecting patient's functional outcome.   REHAB POTENTIAL: Excellent  CLINICAL DECISION MAKING: Evolving/moderate complexity  EVALUATION COMPLEXITY: Moderate   GOALS: Goals reviewed with patient? Yes  SHORT TERM GOALS: Target date: 08/15/22  Ind with initial HEP Baseline: Goal status: MET  2.  Pt will understand correct sitting posture and be able to adjust at work as needed Baseline:  Goal status: MET   LONG TERM GOALS: Target date: 12/12/2022   Update 09/19/22  Pt will be independent with advanced HEP to maintain improvements made throughout therapy  Baseline:  Goal status: IN PROGRESS  2.  Pt will demonstrate 5/5 hip abduction for improved pelvic stability during functional activities Baseline:  Goal status: IN PROGRESS  3.  Pt will report 5/10 pain at the most during menstrual cycle for ability to function at work Baseline: 10/10  Goal status: IN PROGRESS  4.  PFIQ score reported 40 at the most Baseline: 62 down from 81 Goal status: IN PROGRESS  5.  Pt will demonstrate improved posture due to increased lumbar muscle length and core strength Baseline: has made posture  improvements, still working on strength to sustain for longer periods of time Goal status: MET   PLAN:  PT FREQUENCY: 1x/week  PT DURATION: 12 weeks  PLANNED INTERVENTIONS: Therapeutic exercises, Therapeutic activity, Neuromuscular re-education, Balance training, Gait training, Patient/Family education, Self Care, Joint mobilization, Dry Needling, Electrical stimulation, Cryotherapy, Moist heat, Taping, Biofeedback, Manual therapy, and Re-evaluation  PLAN FOR NEXT SESSION: stretch and relax lumbar and gluteals, and dry needling #3; f/u on sitting posture, transversus abdominus activation and core progression of strength supine and quadruped  [employer], PT 09/19/2022, 10:07 AM

## 2022-09-19 ENCOUNTER — Ambulatory Visit: Payer: Medicaid Other | Attending: Obstetrics and Gynecology | Admitting: Physical Therapy

## 2022-09-19 ENCOUNTER — Encounter: Payer: Self-pay | Admitting: Physical Therapy

## 2022-09-19 DIAGNOSIS — M6281 Muscle weakness (generalized): Secondary | ICD-10-CM | POA: Diagnosis present

## 2022-09-19 DIAGNOSIS — R293 Abnormal posture: Secondary | ICD-10-CM | POA: Insufficient documentation

## 2022-09-19 DIAGNOSIS — R252 Cramp and spasm: Secondary | ICD-10-CM | POA: Insufficient documentation

## 2022-10-01 ENCOUNTER — Ambulatory Visit: Payer: Medicaid Other | Attending: Obstetrics and Gynecology

## 2022-10-01 DIAGNOSIS — M6281 Muscle weakness (generalized): Secondary | ICD-10-CM | POA: Diagnosis present

## 2022-10-01 DIAGNOSIS — R293 Abnormal posture: Secondary | ICD-10-CM | POA: Diagnosis present

## 2022-10-01 DIAGNOSIS — R252 Cramp and spasm: Secondary | ICD-10-CM | POA: Insufficient documentation

## 2022-10-01 NOTE — Therapy (Addendum)
OUTPATIENT PHYSICAL THERAPY FEMALE PELVIC Treatment   Patient Name: Lindsay Roberts MRN: [id number] DOB:March 27, 1985, 37 y.o., female Today's Date: 10/01/2022  END OF SESSION:  PT End of Session - 10/01/22 0810     Visit Number 4    Date for PT Re-Evaluation 12/12/22    Authorization Type [employer]    Authorization Time Period 09/23/2022-11/21/2022    Authorization - Visit Number 1    Authorization - Number of Visits 5    PT Start Time 0808    PT Stop Time 0851    PT Time Calculation (min) 43 min    Activity Tolerance Patient tolerated treatment well    Behavior During Therapy WFL for tasks assessed/performed                Past Medical History:  Diagnosis Date   Medical history non-contributory    Past Surgical History:  Procedure Laterality Date   NO PAST SURGERIES     Patient Active Problem List   Diagnosis Date Noted   Supervision of normal pregnancy in first trimester 03/05/2015   Rubella non-immune status, antepartum 03/05/2015    PCP: [facility]   REFERRING PROVIDER: Lorriane Shire, MD   REFERRING DIAG:  R10.2 (ICD-10-CM) - Pelvic pain  M79.10 (ICD-10-CM) - Myalgia    THERAPY DIAG:  Cramp and spasm  Muscle weakness (generalized)  Abnormal posture  Rationale for Evaluation and Treatment: Rehabilitation  ONSET DATE: Nov/Dec 2023  SUBJECTIVE:                                                                                                                                                                                           SUBJECTIVE STATEMENT: Pt still having some pain in Rt lower abdomen with cycles, but it isn't as severe and doesn't last as long (last cycle started July 16th) - 0/10 pain currently, but pain still got up to 10/10 during last cycle   PAIN:  Are you having pain? No   PRECAUTIONS: None  WEIGHT BEARING RESTRICTIONS: No  FALLS:  Has patient fallen in last 6 months? No  LIVING ENVIRONMENT: Lives  with: lives with their son 60 y/o Lives in: House/apartment   OCCUPATION: full time at a desk job  PLOF: Independent  PATIENT GOALS: get rid of debilitating pain to be able to function during cycle  PERTINENT HISTORY:  PMH ovarian cyst Rt side Sexual abuse: No  BOWEL MOVEMENT: Pain with bowel movement: No Type of bowel movement:Type (Bristol Stool Scale) normal, Frequency 2/day, and Strain No   URINATION: Pain with urination: No Fully empty bladder: Yes:   Stream: Weak during cycle  but other than that strong Urgency: No Frequency: normal Leakage:  No   INTERCOURSE: Pain with intercourse:  No   PREGNANCY: Vaginal deliveries 1 Tearing Yes: 3 stitches posterior vaginal   PROLAPSE: None   OBJECTIVE:   DIAGNOSTIC FINDINGS:    PATIENT SURVEYS:    PFIQ-7 - 81 score for pelvic pain  COGNITION: Overall cognitive status: Within functional limits for tasks assessed     SENSATION: Light touch:  Proprioception:   MUSCLE LENGTH: Hamstrings: Right ~80 deg; Left ~85 deg Thomas test:   LUMBAR SPECIAL TESTS:  Straight leg raise test: Negative ASLR improves with compression suggesting core weakness; ASLR with pelvic instability doing Lt leg raise FUNCTIONAL TESTS:  SLS trendelenburg Rt>Lt  GAIT:  Comments: WFL   POSTURE: rounded shoulders, forward head, increased lumbar lordosis, and anterior pelvic tilt  PELVIC ALIGNMENT: normal  LUMBARAROM/PROM:  A/PROM A/PROM  eval  Flexion 75%  Extension   Right lateral flexion WFL   Left lateral flexion 90%  Right rotation   Left rotation    (Blank rows = not tested)  LOWER EXTREMITY ROM:  Passive ROM Right eval Left eval  Hip flexion [facility]   Hip extension    Hip abduction    Hip adduction    Hip internal rotation 50% 75%  Hip external rotation 50% 75%  Knee flexion    Knee extension    Ankle dorsiflexion    Ankle plantarflexion    Ankle inversion    Ankle eversion     (Blank rows = not  tested)  LOWER EXTREMITY MMT:  MMT Right eval Left eval  Hip flexion 4+/5 4+/5  Hip extension 4/5 4/5  Hip abduction 4/5 4/5  Hip adduction 5/5 5  Hip internal rotation 4/5 5  Hip external rotation 4/5 5  Knee flexion    Knee extension    Ankle dorsiflexion    Ankle plantarflexion    Ankle inversion    Ankle eversion     PALPATION:   General  lumbar paraspinals tight, adductors tight                External Perineal Exam tension palpated, ticklish across transverse peroneus, adductor attachments tight and ischiocavernosis tight                             Internal Pelvic Floor referred pain ileococcygeus and obturator on Rt side, tight and high tone bil levators  Patient confirms identification and approves PT to assess internal pelvic floor and treatment Yes  PELVIC MMT:   MMT eval  Vaginal NA  Internal Anal Sphincter   External Anal Sphincter   Puborectalis   Diastasis Recti   (Blank rows = not tested)        TONE: high  PROLAPSE: no  TODAY'S TREATMENT:  DATE:  10/01/22 Manual: Pt provides verbal consent for internal vaginal/rectal pelvic floor exam. Rt pelvic floor muscle release bil, Rt>Lt, superficial and deep layers Neuromuscular re-education: Diaphragmatic breathing Pelvic floor muscle bulge Therapeutic activities: Reviewed cat cow and child's pose to help with pain during cycle Pelvic floor muscle wand  Lubricant samples given    09/19/22  Manual: lumbar paraspinals and gluteals bil Trigger Point Dry-Needling  Treatment instructions: Expect mild to moderate muscle soreness. S/S of pneumothorax if dry needled over a lung field, and to seek immediate medical attention should they occur. Patient verbalized understanding of these instructions and education.  Patient Consent Given: Yes Education handout provided: Yes on  medbridge Muscles treated: lumbar multifidi, bil glute med, min, piriformis Electrical stimulation performed: No Parameters: N/A Treatment response/outcome: increased soft tissue length  Exercises: Supine alt LE march, alternating UE pressing, UE arches       PATIENT EDUCATION:  Education details: Access Code: [email or web address] Person educated: Patient Education method: Explanation, Verbal cues, and Handouts Education comprehension: verbalized understanding and needs further education  HOME EXERCISE PROGRAM: [id number] - added qped circle and piriformis stretch and dry needling   ASSESSMENT:  CLINICAL IMPRESSION: Pt states that pain is not lasting as long during menstrual cycle, but it does still seem to be severe. Due to this, we decided to perform internal pelvic floor muscle release today. Significant tension and referral into Rt lower quadrant with release of Rt pelvic floor. Believe this will be very beneficial in reducing the pain that she has during cycle and giving her a pain management option during her cycle. We discussed the pelvic floor muscle wand and patient ordered today. She will plan to start using and bring in to her next appointment if she has any trouble using it. Pt will benefit from skilled PT to continue to address flexibility and postural strength for pain management  OBJECTIVE IMPAIRMENTS: decreased ROM, decreased strength, increased muscle spasms, impaired flexibility, impaired tone, postural dysfunction, and pain.   ACTIVITY LIMITATIONS: carrying, lifting, sitting, standing, and caring for others  PARTICIPATION LIMITATIONS: meal prep, cleaning, laundry, driving, community activity, and occupation  PERSONAL FACTORS: Time since onset of injury/illness/exacerbation and 1-2 comorbidities: vaginal delivery with tearing and ovarian cyst on right side  are also affecting patient's functional outcome.   REHAB POTENTIAL: Excellent  CLINICAL DECISION MAKING:  Evolving/moderate complexity  EVALUATION COMPLEXITY: Moderate   GOALS: Goals reviewed with patient? Yes  SHORT TERM GOALS: Target date: 08/15/22  Ind with initial HEP Baseline: Goal status: MET  2.  Pt will understand correct sitting posture and be able to adjust at work as needed Baseline:  Goal status: MET   LONG TERM GOALS: Target date: 12/12/2022   Update 09/19/22  Pt will be independent with advanced HEP to maintain improvements made throughout therapy  Baseline:  Goal status: IN PROGRESS  2.  Pt will demonstrate 5/5 hip abduction for improved pelvic stability during functional activities Baseline:  Goal status: IN PROGRESS  3.  Pt will report 5/10 pain at the most during menstrual cycle for ability to function at work Baseline: 10/10  Goal status: IN PROGRESS  4.  PFIQ score reported 40 at the most Baseline: 62 down from 81 Goal status: IN PROGRESS  5.  Pt will demonstrate improved posture due to increased lumbar muscle length and core strength Baseline: has made posture improvements, still working on strength to sustain for longer periods of time Goal status: MET   PLAN:  PT FREQUENCY: 1x/week  PT DURATION: 12 weeks  PLANNED INTERVENTIONS: Therapeutic exercises, Therapeutic activity, Neuromuscular re-education, Balance training, Gait training, Patient/Family education, Self Care, Joint mobilization, Dry Needling, Electrical stimulation, Cryotherapy, Moist heat, Taping, Biofeedback, Manual therapy, and Re-evaluation  PLAN FOR NEXT SESSION: stretch and relax lumbar and gluteals, and dry needling #3; f/u on sitting posture, transversus abdominus activation and core progression of strength supine and quadruped; continue pelvic floor muscle release to tolerance; check in and see how the wand is going and help her use if necessary  Julio Alm, PT, DPT08/07/248:56 AM  PHYSICAL THERAPY DISCHARGE SUMMARY  Visits from Start of Care: 4  Current functional  level related to goals / functional outcomes: Not met   Remaining deficits: See above   Education / Equipment: HEP   Patient agrees to discharge. Patient goals were not met. Patient is being discharged due to  poor attendance (8 cancellations).  Julio Alm, PT, DPT08/26/247:57 AM

## 2022-10-13 ENCOUNTER — Ambulatory Visit: Payer: Medicaid Other

## 2022-10-20 ENCOUNTER — Ambulatory Visit: Payer: Medicaid Other

## 2023-12-18 ENCOUNTER — Other Ambulatory Visit: Payer: Self-pay

## 2023-12-18 ENCOUNTER — Emergency Department (HOSPITAL_BASED_OUTPATIENT_CLINIC_OR_DEPARTMENT_OTHER)
Admission: EM | Admit: 2023-12-18 | Discharge: 2023-12-18 | Disposition: A | Discharge status: Home / Self Care | Attending: Emergency Medicine | Admitting: Emergency Medicine

## 2023-12-18 ENCOUNTER — Encounter (HOSPITAL_BASED_OUTPATIENT_CLINIC_OR_DEPARTMENT_OTHER): Payer: Self-pay

## 2023-12-18 ENCOUNTER — Emergency Department (HOSPITAL_BASED_OUTPATIENT_CLINIC_OR_DEPARTMENT_OTHER)

## 2023-12-18 DIAGNOSIS — M25572 Pain in left ankle and joints of left foot: Secondary | ICD-10-CM

## 2023-12-18 DIAGNOSIS — X501XXA Overexertion from prolonged static or awkward postures, initial encounter: Secondary | ICD-10-CM | POA: Diagnosis not present

## 2023-12-18 DIAGNOSIS — S93402A Sprain of unspecified ligament of left ankle, initial encounter: Secondary | ICD-10-CM | POA: Diagnosis not present

## 2023-12-18 MED ORDER — OXYCODONE-ACETAMINOPHEN 5-325 MG PO TABS
1.0000 | ORAL_TABLET | ORAL | Status: DC | PRN
  Administered 2023-12-18: 1 via ORAL
  Filled 2023-12-18: qty 1

## 2023-12-18 NOTE — ED Triage Notes (Signed)
 Patient states I did something to my ankle and heard a pop when I stepped off the curb. States this occurred Thursday morning. Patient arrives with wrap on the ankle and crutches. Patient states its more swollen and pain has increased. Pain is rated 8/10. States she cannot put any weight on left ankle.

## 2023-12-18 NOTE — Discharge Instructions (Signed)
 Overall I do think you might have a small fracture in the ankle but mostly I think it is related to a bad ankle sprain.  As we discussed I recommend weightbearing as tolerated with walking boot or crutches.  Follow-up with orthopedics.  I recommend 1000 mg of Tylenol  every 6 hours as needed for pain.  Recommend Aleve or ibuprofen  as well.  Recommend ice 20 minutes on and off.  Keep your leg elevated when you can.

## 2023-12-18 NOTE — ED Provider Notes (Signed)
 [place] EMERGENCY DEPARTMENT AT [facility] Provider Note   CSN: [id number] Arrival date & time: 12/18/23  2013     Patient presents with: Ankle Injury   Lindsay Roberts is a 38 y.o. female.   Left ankle pain after she rolled her ankle on a curb yesterday.  Not have any pain elsewhere.  This occurred yesterday.  She has been using crutches and Ace wrap.  Little bit more swollen and painful today.  Having a hard time putting weight on it even with crutches.  No prior injuries.  Is not having any pain elsewhere.  The history is provided by the patient.       Prior to Admission medications   Medication Sig Start Date End Date Taking? Authorizing Provider  cyclobenzaprine  (FLEXERIL ) 10 MG tablet Take 1 tablet (10 mg total) by mouth every 8 (eight) hours as needed for muscle spasms. 05/28/22   Ajewole, Christana, MD  ferrous sulfate 325 (65 FE) MG tablet Take 325 mg by mouth daily. Patient not taking: Reported on 05/28/2022 12/10/17   [provider]  Multiple Vitamin (DAILY VALUE MULTIVITAMIN) TABS  01/23/21   [provider]  sertraline (ZOLOFT) 50 MG tablet Take 50 mg by mouth daily. Patient not taking: Reported on 05/28/2022 12/09/17   [provider]    Allergies: Patient has no known allergies.    Review of Systems  Updated Vital Signs BP 118/81 (BP Location: Right Arm)   Pulse 76   Temp 98.5 F (36.9 C) (Oral)   Resp 18   Ht 5' 5 (1.651 m)   Wt 115.7 kg   LMP 12/09/2023   SpO2 100%   BMI 42.43 kg/m   Physical Exam Vitals and nursing note reviewed.  Constitutional:      General: She is not in acute distress.    Appearance: She is well-developed.  HENT:     Head: Normocephalic and atraumatic.  Cardiovascular:     Pulses: Normal pulses.  Pulmonary:     Effort: Pulmonary effort is normal.  Abdominal:     Palpations: Abdomen is soft.  Musculoskeletal:        General: Swelling and tenderness present.     Comments: Tenderness  and swelling to the left ankle with no obvious deformity, no tenderness elsewhere  Skin:    General: Skin is warm and dry.     Capillary Refill: Capillary refill takes less than 2 seconds.  Neurological:     Mental Status: She is alert.     Sensory: No sensory deficit.     Motor: No weakness.     (all labs ordered are listed, but only abnormal results are displayed) Labs Reviewed - No data to display  EKG: None  Radiology: DG Ankle Left Port Result Date: 12/18/2023 CLINICAL DATA:  Pain, injured ankle stepping off a curb. EXAM: PORTABLE LEFT ANKLE - 2 VIEW COMPARISON:  None Available. FINDINGS: Tiny ossific densities distal to the fibular tip may represent small avulsion type fractures. No additional fracture. The ankle mortise is preserved. Soft tissue edema is most prominent laterally. IMPRESSION: Tiny ossific densities distal to the fibular tip may represent small avulsion type fractures. Electronically Signed   By: Andrea Gasman M.D.   On: 12/18/2023 20:51     Procedures   Medications Ordered in the ED - No data to display  Medical Decision Making Amount and/or Complexity of Data Reviewed Radiology: ordered.   Lindsay Roberts is here with left ankle pain after she rolled her ankle yesterday.  Differential diagnosis sprain versus fracture.  She is neurovascular neuromuscular intact.  Not have any pain elsewhere.  No prior injury.  X-ray shows tiny ossific densities distal to the fibular tip which could be a small avulsion type fracture but ultimately there is no major fracture component.  I do think that this is a ankle sprain we will put her in a walking boot/crutches.  Recommend Tylenol  ibuprofen /Aleve and ice.  Weightbearing as tolerated with walking boot and crutches.  Follow-up with orthopedics.  Discharged in good condition.  Understands return precautions.  This chart was dictated using voice recognition software.  Despite best  efforts to proofread,  errors can occur which can change the documentation meaning.      Final diagnoses:  Acute left ankle pain  Sprain of left ankle, unspecified ligament, initial encounter    ED Discharge Orders     None          Ruthe Cornet, DO 12/18/23 2109
